# Patient Record
Sex: Female | Born: 1960 | Race: White | Hispanic: No | Marital: Married | Smoking: Never smoker
Health system: Southern US, Community
[De-identification: ages and names within clinical notes are randomized; demographics above are authoritative.]

## PROBLEM LIST (undated history)

## (undated) DIAGNOSIS — K589 Irritable bowel syndrome without diarrhea: Secondary | ICD-10-CM

## (undated) DIAGNOSIS — R0789 Other chest pain: Secondary | ICD-10-CM

## (undated) DIAGNOSIS — N3010 Interstitial cystitis (chronic) without hematuria: Secondary | ICD-10-CM

## (undated) DIAGNOSIS — D649 Anemia, unspecified: Secondary | ICD-10-CM

## (undated) DIAGNOSIS — K219 Gastro-esophageal reflux disease without esophagitis: Secondary | ICD-10-CM

## (undated) DIAGNOSIS — N3281 Overactive bladder: Secondary | ICD-10-CM

## (undated) DIAGNOSIS — W3400XA Accidental discharge from unspecified firearms or gun, initial encounter: Secondary | ICD-10-CM

## (undated) DIAGNOSIS — F502 Bulimia nervosa, unspecified: Secondary | ICD-10-CM

## (undated) DIAGNOSIS — E785 Hyperlipidemia, unspecified: Secondary | ICD-10-CM

## (undated) HISTORY — DX: Accidental discharge from unspecified firearms or gun, initial encounter: W34.00XA

## (undated) HISTORY — PX: OTHER SURGICAL HISTORY: SHX169

## (undated) HISTORY — DX: Gastro-esophageal reflux disease without esophagitis: K21.9

## (undated) HISTORY — DX: Bulimia nervosa: F50.2

## (undated) HISTORY — DX: Other chest pain: R07.89

## (undated) HISTORY — DX: Interstitial cystitis (chronic) without hematuria: N30.10

## (undated) HISTORY — PX: APPENDECTOMY: SHX54

## (undated) HISTORY — PX: BREAST ENHANCEMENT SURGERY: SHX7

## (undated) HISTORY — DX: Irritable bowel syndrome, unspecified: K58.9

## (undated) HISTORY — DX: Overactive bladder: N32.81

## (undated) HISTORY — PX: EXCISIONAL HEMORRHOIDECTOMY: SHX1541

## (undated) HISTORY — PX: ABDOMINAL EXPLORATION SURGERY: SHX538

## (undated) HISTORY — PX: CHOLECYSTECTOMY: SHX55

## (undated) HISTORY — DX: Hyperlipidemia, unspecified: E78.5

## (undated) HISTORY — DX: Bulimia nervosa, unspecified: F50.20

## (undated) HISTORY — DX: Anemia, unspecified: D64.9

---

## 1989-02-28 HISTORY — PX: AUGMENTATION MAMMAPLASTY: SUR837

## 2001-06-14 LAB — HM PAP SMEAR

## 2003-12-17 ENCOUNTER — Ambulatory Visit: Payer: Self-pay | Admitting: Internal Medicine

## 2004-08-05 ENCOUNTER — Ambulatory Visit: Payer: Self-pay

## 2004-09-24 ENCOUNTER — Other Ambulatory Visit: Payer: Self-pay

## 2004-09-27 ENCOUNTER — Ambulatory Visit: Payer: Self-pay | Admitting: Internal Medicine

## 2005-03-28 ENCOUNTER — Ambulatory Visit: Payer: Self-pay | Admitting: Internal Medicine

## 2006-04-24 ENCOUNTER — Ambulatory Visit: Payer: Self-pay | Admitting: Internal Medicine

## 2006-04-24 LAB — HM DEXA SCAN

## 2006-04-27 ENCOUNTER — Ambulatory Visit: Payer: Self-pay | Admitting: Internal Medicine

## 2006-08-18 ENCOUNTER — Ambulatory Visit: Payer: Self-pay | Admitting: Gastroenterology

## 2006-08-18 LAB — HM COLONOSCOPY

## 2007-06-13 ENCOUNTER — Ambulatory Visit: Payer: Self-pay | Admitting: Internal Medicine

## 2007-06-15 ENCOUNTER — Ambulatory Visit: Payer: Self-pay | Admitting: Internal Medicine

## 2007-06-20 ENCOUNTER — Ambulatory Visit: Payer: Self-pay | Admitting: Internal Medicine

## 2007-07-03 ENCOUNTER — Encounter: Admission: RE | Admit: 2007-07-03 | Discharge: 2007-07-03 | Payer: Self-pay | Admitting: Internal Medicine

## 2007-08-06 ENCOUNTER — Ambulatory Visit: Payer: Self-pay | Admitting: General Surgery

## 2008-02-08 ENCOUNTER — Emergency Department: Payer: Self-pay | Admitting: Emergency Medicine

## 2008-02-29 HISTORY — PX: BREAST BIOPSY: SHX20

## 2008-05-21 LAB — HM PAP SMEAR: HM Pap smear: NEGATIVE

## 2008-07-30 ENCOUNTER — Ambulatory Visit: Payer: Self-pay | Admitting: Urology

## 2008-12-15 ENCOUNTER — Ambulatory Visit: Payer: Self-pay | Admitting: Internal Medicine

## 2010-03-24 ENCOUNTER — Ambulatory Visit: Payer: Self-pay | Admitting: Internal Medicine

## 2010-05-15 ENCOUNTER — Inpatient Hospital Stay: Payer: Self-pay | Admitting: Internal Medicine

## 2011-06-15 ENCOUNTER — Ambulatory Visit: Payer: Self-pay | Admitting: Internal Medicine

## 2012-01-17 ENCOUNTER — Other Ambulatory Visit: Payer: Self-pay | Admitting: Internal Medicine

## 2012-01-17 ENCOUNTER — Telehealth: Payer: Self-pay | Admitting: Internal Medicine

## 2012-01-17 DIAGNOSIS — E7800 Pure hypercholesterolemia, unspecified: Secondary | ICD-10-CM

## 2012-01-17 NOTE — Telephone Encounter (Signed)
Pt spouse came in today and wanted to get rx for citalopram  competely out meds [facility] Pt also has been taking lipitor and has never had lab work for this. Do you want labs prior to her visit  If labs are wanted 01/25/12

## 2012-01-17 NOTE — Telephone Encounter (Signed)
i am ok with refilling her citalopram 10mg .  (will need to clarify how she takes the medicine).  i think she is on 1 1/2 tab q day but clarify with her and then ok to refill x 3.  Also regarding lab - she can come in for lipid profile and liver panel.  i will order - need to schedule appt.

## 2012-02-29 ENCOUNTER — Emergency Department: Payer: Self-pay | Admitting: Unknown Physician Specialty

## 2012-02-29 LAB — URINALYSIS, COMPLETE
Bilirubin,UR: NEGATIVE
Blood: NEGATIVE
Glucose,UR: NEGATIVE mg/dL (ref 0–75)
Ketone: NEGATIVE
Ph: 7 (ref 4.5–8.0)
Specific Gravity: 1.014 (ref 1.003–1.030)
Squamous Epithelial: 1

## 2012-02-29 LAB — CBC
HGB: 13.3 g/dL (ref 12.0–16.0)
MCH: 31 pg (ref 26.0–34.0)
MCHC: 34 g/dL (ref 32.0–36.0)
MCV: 91 fL (ref 80–100)
Platelet: 210 10*3/uL (ref 150–440)
RBC: 4.28 10*6/uL (ref 3.80–5.20)
RDW: 12.4 % (ref 11.5–14.5)
WBC: 7.2 10*3/uL (ref 3.6–11.0)

## 2012-02-29 LAB — COMPREHENSIVE METABOLIC PANEL
Bilirubin,Total: 0.2 mg/dL (ref 0.2–1.0)
Chloride: 105 mmol/L (ref 98–107)
Co2: 28 mmol/L (ref 21–32)
Creatinine: 1.01 mg/dL (ref 0.60–1.30)
EGFR (Non-African Amer.): >60
Glucose: 99 mg/dL (ref 65–99)
Osmolality: 281 (ref 275–301)
Potassium: 3.4 mmol/L — ABNORMAL LOW (ref 3.5–5.1)
SGOT(AST): 31 U/L (ref 15–37)
SGPT (ALT): 34 U/L (ref 12–78)

## 2012-02-29 LAB — LIPASE, BLOOD: Lipase: 146 U/L (ref 73–393)

## 2012-04-04 ENCOUNTER — Encounter: Payer: Self-pay | Admitting: Internal Medicine

## 2012-04-04 DIAGNOSIS — Z860100 Personal history of colon polyps, unspecified: Secondary | ICD-10-CM

## 2012-04-06 ENCOUNTER — Encounter: Payer: Self-pay | Admitting: Internal Medicine

## 2012-04-06 ENCOUNTER — Ambulatory Visit (INDEPENDENT_AMBULATORY_CARE_PROVIDER_SITE_OTHER): Payer: BC Managed Care – PPO | Admitting: Internal Medicine

## 2012-04-06 VITALS — BP 120/80 | HR 98 | Temp 98.2°F | Ht 64.5 in | Wt 132.8 lb

## 2012-04-06 DIAGNOSIS — F418 Other specified anxiety disorders: Secondary | ICD-10-CM

## 2012-04-06 DIAGNOSIS — E7800 Pure hypercholesterolemia, unspecified: Secondary | ICD-10-CM

## 2012-04-06 DIAGNOSIS — R5383 Other fatigue: Secondary | ICD-10-CM

## 2012-04-06 DIAGNOSIS — F411 Generalized anxiety disorder: Secondary | ICD-10-CM

## 2012-04-06 DIAGNOSIS — K5289 Other specified noninfective gastroenteritis and colitis: Secondary | ICD-10-CM

## 2012-04-06 DIAGNOSIS — K529 Noninfective gastroenteritis and colitis, unspecified: Secondary | ICD-10-CM

## 2012-04-06 DIAGNOSIS — R5381 Other malaise: Secondary | ICD-10-CM

## 2012-04-06 DIAGNOSIS — D649 Anemia, unspecified: Secondary | ICD-10-CM

## 2012-04-06 MED ORDER — PANTOPRAZOLE SODIUM 40 MG PO TBEC: 40.0000 mg | DELAYED_RELEASE_TABLET | Freq: Every day | ORAL | Status: DC

## 2012-04-06 MED ORDER — CITALOPRAM HYDROBROMIDE 10 MG PO TABS: ORAL_TABLET | ORAL | Status: DC

## 2012-04-08 ENCOUNTER — Encounter: Payer: Self-pay | Admitting: Internal Medicine

## 2012-04-08 DIAGNOSIS — E7800 Pure hypercholesterolemia, unspecified: Secondary | ICD-10-CM | POA: Insufficient documentation

## 2012-04-08 DIAGNOSIS — D649 Anemia, unspecified: Secondary | ICD-10-CM | POA: Insufficient documentation

## 2012-04-08 DIAGNOSIS — K529 Noninfective gastroenteritis and colitis, unspecified: Secondary | ICD-10-CM | POA: Insufficient documentation

## 2012-04-08 DIAGNOSIS — F418 Other specified anxiety disorders: Secondary | ICD-10-CM | POA: Insufficient documentation

## 2012-04-08 NOTE — Assessment & Plan Note (Signed)
Recheck cbc.  

## 2012-04-08 NOTE — Assessment & Plan Note (Signed)
Increased stress.  Tolerated citalopram.  Will restart citalopram.  Start citalopram 10mg  - 1 1/2 tablet q day.  Follow.  Get her back in soon to reassess.

## 2012-04-08 NOTE — Assessment & Plan Note (Signed)
Currently symptoms stable.  Has had some flares.  Continues to follow up with GI.

## 2012-04-08 NOTE — Assessment & Plan Note (Signed)
Low cholesterol diet and exercise.  On atorvastatin.  Check lipid panel and liver function.
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
## 2012-04-11 ENCOUNTER — Telehealth: Payer: Self-pay | Admitting: Internal Medicine

## 2012-04-11 NOTE — Telephone Encounter (Signed)
Pt would like to get an order sent to [facility]  On [address] in [place].  Pt is going to try to go Friday.

## 2012-04-11 NOTE — Telephone Encounter (Signed)
Pt notified.  [facility] order form at desk.

## 2012-04-13 ENCOUNTER — Other Ambulatory Visit: Payer: BC Managed Care – PPO

## 2012-04-14 ENCOUNTER — Other Ambulatory Visit: Payer: Self-pay

## 2012-04-18 ENCOUNTER — Telehealth: Payer: Self-pay | Admitting: *Deleted

## 2012-04-18 NOTE — Telephone Encounter (Signed)
Left message on patients vm regarding lab results

## 2012-04-19 ENCOUNTER — Other Ambulatory Visit: Payer: Self-pay | Admitting: General Practice

## 2012-04-19 ENCOUNTER — Telehealth: Payer: Self-pay | Admitting: Internal Medicine

## 2012-04-19 DIAGNOSIS — E039 Hypothyroidism, unspecified: Secondary | ICD-10-CM

## 2012-04-19 MED ORDER — ATORVASTATIN CALCIUM 20 MG PO TABS: 20.0000 mg | ORAL_TABLET | Freq: Every day | ORAL | Status: DC

## 2012-04-19 NOTE — Telephone Encounter (Signed)
Please refer to the lab sheet on your desk.  You have already notified pt of her labs.  The follow up lab is a non fasting lab.  I have placed order to have drawn here.  Please schedule her for a non fasting lab appt time in 4 weeks.  Also I sent in to her pharmacy ([facility]) a prescription for lipitor 20mg  q day.  Would like for her to start this.

## 2012-04-20 NOTE — Telephone Encounter (Signed)
Notified patient's husband that her labs will be non-fasting.

## 2012-04-20 NOTE — Telephone Encounter (Signed)
Should have been sent to you.  See attached

## 2012-04-20 NOTE — Telephone Encounter (Signed)
Marcelino Duster I am sending ing this note to you regarding the patient. Working at [employer]

## 2012-04-26 ENCOUNTER — Other Ambulatory Visit (INDEPENDENT_AMBULATORY_CARE_PROVIDER_SITE_OTHER): Payer: BC Managed Care – PPO

## 2012-04-26 DIAGNOSIS — E039 Hypothyroidism, unspecified: Secondary | ICD-10-CM

## 2012-04-26 DIAGNOSIS — R5381 Other malaise: Secondary | ICD-10-CM

## 2012-04-26 DIAGNOSIS — E7800 Pure hypercholesterolemia, unspecified: Secondary | ICD-10-CM

## 2012-04-26 DIAGNOSIS — R5383 Other fatigue: Secondary | ICD-10-CM

## 2012-04-27 LAB — COMPREHENSIVE METABOLIC PANEL
ALT: 22 U/L (ref 0–35)
AST: 29 U/L (ref 0–37)
Alkaline Phosphatase: 92 U/L (ref 39–117)
BUN: 14 mg/dL (ref 6–23)
Calcium: 9.3 mg/dL (ref 8.4–10.5)
Creatinine, Ser: 0.9 mg/dL (ref 0.4–1.2)
Total Bilirubin: 0.5 mg/dL (ref 0.3–1.2)

## 2012-04-27 LAB — CBC WITH DIFFERENTIAL/PLATELET
Basophils Absolute: 0.1 10*3/uL (ref 0.0–0.1)
Basophils Relative: 0.8 % (ref 0.0–3.0)
Eosinophils Absolute: 0 10*3/uL (ref 0.0–0.7)
HCT: 36.2 % (ref 36.0–46.0)
Lymphocytes Relative: 26.9 % (ref 12.0–46.0)
Lymphs Abs: 2 10*3/uL (ref 0.7–4.0)
Monocytes Absolute: 0.4 10*3/uL (ref 0.1–1.0)
Neutro Abs: 4.9 10*3/uL (ref 1.4–7.7)
Neutrophils Relative %: 66.5 % (ref 43.0–77.0)
Platelets: 211 10*3/uL (ref 150.0–400.0)
WBC: 7.3 10*3/uL (ref 4.5–10.5)

## 2012-04-27 LAB — LIPID PANEL
Cholesterol: 204 mg/dL — ABNORMAL HIGH (ref 0–200)
HDL: 28.4 mg/dL — ABNORMAL LOW (ref >39.00)
Total CHOL/HDL Ratio: 7
Triglycerides: 373 mg/dL — ABNORMAL HIGH (ref 0.0–149.0)

## 2012-04-27 LAB — HEPATIC FUNCTION PANEL
ALT: 22 U/L (ref 0–35)
AST: 29 U/L (ref 0–37)
Albumin: 4.1 g/dL (ref 3.5–5.2)

## 2012-04-27 LAB — LDL CHOLESTEROL, DIRECT: Direct LDL: 122.5 mg/dL

## 2012-04-28 ENCOUNTER — Telehealth: Payer: Self-pay | Admitting: Internal Medicine

## 2012-04-28 NOTE — Telephone Encounter (Signed)
This pt should have only had a liver panel checked.  She had called and picked up a lab order for [facility] for the other labs.  Please delete charges for the other labs other than the liver panel.  Thanks.

## 2012-05-01 ENCOUNTER — Telehealth: Payer: Self-pay | Admitting: *Deleted

## 2012-05-01 NOTE — Telephone Encounter (Signed)
Faxed over the form so the other labs can be credited back to the patient

## 2012-05-02 ENCOUNTER — Telehealth: Payer: Self-pay | Admitting: *Deleted

## 2012-05-02 NOTE — Telephone Encounter (Signed)
Letter sent to patient letting her know that her labs from [facility] came back ok.

## 2012-05-21 ENCOUNTER — Ambulatory Visit: Payer: Self-pay | Admitting: Gastroenterology

## 2012-05-21 ENCOUNTER — Encounter: Payer: Self-pay | Admitting: Internal Medicine

## 2012-05-21 LAB — HM COLONOSCOPY

## 2012-05-22 LAB — PATHOLOGY REPORT

## 2012-06-06 DIAGNOSIS — Z86010 Personal history of colonic polyps: Secondary | ICD-10-CM | POA: Insufficient documentation

## 2012-06-14 ENCOUNTER — Encounter: Payer: BC Managed Care – PPO | Admitting: Internal Medicine

## 2012-06-15 ENCOUNTER — Encounter: Payer: Self-pay | Admitting: Internal Medicine

## 2012-06-18 ENCOUNTER — Ambulatory Visit (INDEPENDENT_AMBULATORY_CARE_PROVIDER_SITE_OTHER): Payer: BC Managed Care – PPO | Admitting: Internal Medicine

## 2012-06-18 ENCOUNTER — Encounter: Payer: Self-pay | Admitting: Internal Medicine

## 2012-06-18 ENCOUNTER — Other Ambulatory Visit (HOSPITAL_COMMUNITY)
Admission: RE | Admit: 2012-06-18 | Discharge: 2012-06-18 | Disposition: A | Discharge status: Home / Self Care | Payer: BC Managed Care – PPO | Source: Ambulatory Visit | Attending: Internal Medicine | Admitting: Internal Medicine

## 2012-06-18 VITALS — BP 128/70 | HR 98 | Temp 98.1°F | Resp 18 | Wt 132.0 lb

## 2012-06-18 DIAGNOSIS — F418 Other specified anxiety disorders: Secondary | ICD-10-CM

## 2012-06-18 DIAGNOSIS — Z1239 Encounter for other screening for malignant neoplasm of breast: Secondary | ICD-10-CM

## 2012-06-18 DIAGNOSIS — Z1151 Encounter for screening for human papillomavirus (HPV): Secondary | ICD-10-CM | POA: Insufficient documentation

## 2012-06-18 DIAGNOSIS — Z860100 Personal history of colon polyps, unspecified: Secondary | ICD-10-CM

## 2012-06-18 DIAGNOSIS — F411 Generalized anxiety disorder: Secondary | ICD-10-CM

## 2012-06-18 DIAGNOSIS — E7800 Pure hypercholesterolemia, unspecified: Secondary | ICD-10-CM

## 2012-06-18 DIAGNOSIS — Z124 Encounter for screening for malignant neoplasm of cervix: Secondary | ICD-10-CM

## 2012-06-18 DIAGNOSIS — D649 Anemia, unspecified: Secondary | ICD-10-CM

## 2012-06-18 DIAGNOSIS — F502 Bulimia nervosa, unspecified: Secondary | ICD-10-CM

## 2012-06-18 DIAGNOSIS — Z01419 Encounter for gynecological examination (general) (routine) without abnormal findings: Secondary | ICD-10-CM | POA: Insufficient documentation

## 2012-06-18 DIAGNOSIS — K5289 Other specified noninfective gastroenteritis and colitis: Secondary | ICD-10-CM

## 2012-06-18 DIAGNOSIS — K529 Noninfective gastroenteritis and colitis, unspecified: Secondary | ICD-10-CM

## 2012-06-18 DIAGNOSIS — K209 Esophagitis, unspecified without bleeding: Secondary | ICD-10-CM

## 2012-06-20 ENCOUNTER — Encounter: Payer: Self-pay | Admitting: Internal Medicine

## 2012-06-25 ENCOUNTER — Encounter: Payer: Self-pay | Admitting: Emergency Medicine

## 2012-06-25 ENCOUNTER — Encounter: Payer: Self-pay | Admitting: Internal Medicine

## 2012-06-25 DIAGNOSIS — F502 Bulimia nervosa: Secondary | ICD-10-CM | POA: Insufficient documentation

## 2012-06-25 NOTE — Assessment & Plan Note (Signed)
Just had colonoscopy as outlined.  Continues to follow up with Dr Oh.   
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
## 2012-06-25 NOTE — Assessment & Plan Note (Signed)
Increased stress.  On citalopram 10mg  - 1 1/2 tablet q day.  Doing better.  Follow.

## 2012-06-25 NOTE — Assessment & Plan Note (Signed)
Low cholesterol diet and exercise.  Did not tolerate the statin.  Off now.  Follow lipid panel and liver function.   

## 2012-06-25 NOTE — Assessment & Plan Note (Signed)
Just had colonoscopy and EGD with results as outlined.  Follow cbc.    

## 2012-06-25 NOTE — Assessment & Plan Note (Signed)
Currently doing well.  Follow.  

## 2012-06-25 NOTE — Assessment & Plan Note (Signed)
Doing better.  Follow.   

## 2012-07-20 ENCOUNTER — Ambulatory Visit: Payer: Self-pay | Admitting: Internal Medicine

## 2012-07-21 ENCOUNTER — Encounter: Payer: Self-pay | Admitting: Internal Medicine

## 2012-07-27 ENCOUNTER — Other Ambulatory Visit: Payer: Self-pay | Admitting: *Deleted

## 2012-07-27 MED ORDER — PANTOPRAZOLE SODIUM 40 MG PO TBEC: 40.0000 mg | DELAYED_RELEASE_TABLET | Freq: Every day | ORAL | Status: DC

## 2012-07-27 MED ORDER — CITALOPRAM HYDROBROMIDE 10 MG PO TABS: ORAL_TABLET | ORAL | Status: DC

## 2012-09-12 ENCOUNTER — Encounter: Payer: Self-pay | Admitting: Internal Medicine

## 2012-09-24 ENCOUNTER — Telehealth: Payer: Self-pay | Admitting: *Deleted

## 2012-09-24 NOTE — Telephone Encounter (Signed)
Called [phone] for prior authorization on the Pantoprazole SOD Dr 20 mg,  Received fax placed in Dr. Lorin Picket folder

## 2012-10-01 NOTE — Telephone Encounter (Signed)
Faxed over [place] request form to [phone]

## 2012-10-02 NOTE — Telephone Encounter (Signed)
Received a fax from [facility], Pantoprazole sodium tablet Dr has been APPROVED   07.14.2014-08.04.2015

## 2012-11-26 ENCOUNTER — Telehealth: Payer: Self-pay | Admitting: Internal Medicine

## 2012-11-26 NOTE — Telephone Encounter (Signed)
Please advise (Copy was placed at your station this morning also)

## 2012-11-26 NOTE — Telephone Encounter (Signed)
°  See below my chart message  Appointment Request From: Linda Perez      With Provider: Charm Barges, MD [-Primary Care Physician-]      Preferred Date Range: From 11/27/2012 To 11/30/2012      Preferred Times: Wednesday Afternoon, Thursday Afternoon, Friday Afternoon      Reason for visit: Annual Physical      Comments:   Dr. Lorin Picket,   I am having some discomfort in my chest along with some jaw pain on my right side and sometimes mild pain in my left arm. The discomfort comes and goes but I've had it for the past couple of weeks. Could you get me an immediate appointment as late in the day as possible with a cardiologists? I don't want to put this off any longer. My number is [phone].      Thank you,   Linda Perez

## 2012-11-26 NOTE — Telephone Encounter (Signed)
LVM for patient on home and cell phone for her to call us in reference to apt scheduled with [facility] 11/28/12 @ 3:15.

## 2012-11-26 NOTE — Telephone Encounter (Signed)
Thanks, let me know if you do not hear back from her.  Thanks.

## 2012-12-11 ENCOUNTER — Encounter: Payer: Self-pay | Admitting: Internal Medicine

## 2012-12-14 ENCOUNTER — Other Ambulatory Visit: Payer: BC Managed Care – PPO

## 2012-12-20 ENCOUNTER — Ambulatory Visit: Payer: BC Managed Care – PPO | Admitting: Internal Medicine

## 2013-01-03 ENCOUNTER — Other Ambulatory Visit: Payer: Self-pay

## 2013-01-08 ENCOUNTER — Other Ambulatory Visit (INDEPENDENT_AMBULATORY_CARE_PROVIDER_SITE_OTHER): Payer: BC Managed Care – PPO

## 2013-01-08 ENCOUNTER — Encounter: Payer: Self-pay | Admitting: Internal Medicine

## 2013-01-08 DIAGNOSIS — E7800 Pure hypercholesterolemia, unspecified: Secondary | ICD-10-CM

## 2013-01-08 LAB — LIPID PANEL
Cholesterol: 208 mg/dL — ABNORMAL HIGH (ref 0–200)
Total CHOL/HDL Ratio: 6

## 2013-01-09 ENCOUNTER — Encounter: Payer: Self-pay | Admitting: Internal Medicine

## 2013-01-10 ENCOUNTER — Encounter: Payer: Self-pay | Admitting: Internal Medicine

## 2013-01-10 NOTE — Telephone Encounter (Signed)
Mailed unread message to pt  

## 2013-01-17 ENCOUNTER — Ambulatory Visit (INDEPENDENT_AMBULATORY_CARE_PROVIDER_SITE_OTHER): Payer: BC Managed Care – PPO | Admitting: Internal Medicine

## 2013-01-17 ENCOUNTER — Encounter: Payer: Self-pay | Admitting: Internal Medicine

## 2013-01-17 VITALS — BP 120/80 | HR 93 | Temp 98.0°F | Ht 64.5 in | Wt 130.5 lb

## 2013-01-17 DIAGNOSIS — F411 Generalized anxiety disorder: Secondary | ICD-10-CM

## 2013-01-17 DIAGNOSIS — E7800 Pure hypercholesterolemia, unspecified: Secondary | ICD-10-CM

## 2013-01-17 DIAGNOSIS — F502 Bulimia nervosa: Secondary | ICD-10-CM

## 2013-01-17 DIAGNOSIS — F418 Other specified anxiety disorders: Secondary | ICD-10-CM

## 2013-01-17 DIAGNOSIS — Z86010 Personal history of colonic polyps: Secondary | ICD-10-CM

## 2013-01-17 DIAGNOSIS — D649 Anemia, unspecified: Secondary | ICD-10-CM

## 2013-01-17 NOTE — Progress Notes (Signed)
Pre-visit discussion using our clinic review tool. No additional management support is needed unless otherwise documented below in the visit note.  

## 2013-01-20 ENCOUNTER — Encounter: Payer: Self-pay | Admitting: Internal Medicine

## 2013-01-20 ENCOUNTER — Telehealth: Payer: Self-pay | Admitting: Internal Medicine

## 2013-01-20 NOTE — Assessment & Plan Note (Signed)
Just had colonoscopy as outlined.  Continues to follow up with Dr Oh.   

## 2013-01-20 NOTE — Telephone Encounter (Signed)
Needs a physical schedule in 4/15 - after 06/18/13.  Please schedule and notify pt of appt date and time.  Thanks.
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
## 2013-01-20 NOTE — Assessment & Plan Note (Signed)
Low cholesterol diet and exercise.  Did not tolerate the statin.  Off now.  Follow lipid panel and liver function.   

## 2013-01-20 NOTE — Assessment & Plan Note (Signed)
Increased stress.  On citalopram 10mg - 1 1/2 tablet q day.  Doing better.  Follow.  Will continue.   

## 2013-01-20 NOTE — Assessment & Plan Note (Signed)
Just had colonoscopy and EGD with results as outlined.  Follow cbc.    

## 2013-01-20 NOTE — Assessment & Plan Note (Signed)
Doing better.  Follow.   

## 2013-01-20 NOTE — Assessment & Plan Note (Signed)
Currently doing well.  Follow.  

## 2013-01-21 ENCOUNTER — Encounter: Payer: Self-pay | Admitting: Internal Medicine

## 2013-01-21 NOTE — Telephone Encounter (Signed)
Left message for pt to call office

## 2013-01-22 NOTE — Telephone Encounter (Signed)
Made appointment for 4/27 @ 3:30  Send pt my chart message

## 2013-01-22 NOTE — Telephone Encounter (Signed)
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 I sent her a mychart asking her to call the office to schedule her physical. Or you can send her an appointment through mychart.

## 2013-02-05 NOTE — Telephone Encounter (Signed)
Appt has been scheduled already for 06/24/13

## 2013-04-16 ENCOUNTER — Ambulatory Visit: Payer: Self-pay | Admitting: Physician Assistant

## 2013-06-24 ENCOUNTER — Encounter: Payer: BC Managed Care – PPO | Admitting: Internal Medicine

## 2013-07-16 ENCOUNTER — Telehealth: Payer: Self-pay | Admitting: Internal Medicine

## 2013-07-16 ENCOUNTER — Encounter: Payer: Self-pay | Admitting: *Deleted

## 2013-07-16 DIAGNOSIS — Z1239 Encounter for other screening for malignant neoplasm of breast: Secondary | ICD-10-CM

## 2013-07-16 NOTE — Telephone Encounter (Signed)
The Tetanus - she had in 2010.  If needs hepatitis A&B - this is a series of shots (over 6 months).  I would recommend her go to the travel clinic in [place] and let them know exactly where she is going and then can give her the shots needed.

## 2013-07-16 NOTE — Telephone Encounter (Signed)
See below my chart message  Is it ok to schedule immunizations  Appointment Request From: Linda Perez      With Provider: Alisa Graff, MD [-Primary Care Physician-]      Preferred Date Range: From 08/19/2013 To 08/30/2013      Preferred Times: Monday Morning, Tuesday Morning, Wednesday Morning, Thursday Morning, Friday Morning      Reason for visit: Office Visit      Comments:   Dr. Nicki Reaper,   I am in need of a mammogram and I also need to check on my immunizations. I am traveling to [place] in July and I will need Hepatitis A &B shots and I may be due for a Tetanus as well. Please let me know how soon I can come in for these. I can be there by 4:15 most any afternoon.      Thank you,      Linda Perez

## 2013-07-16 NOTE — Telephone Encounter (Signed)
Response sent to patient via mychart.

## 2013-07-18 NOTE — Telephone Encounter (Signed)
Unread mychart message mailed to patient 

## 2013-07-19 ENCOUNTER — Encounter: Payer: Self-pay | Admitting: Internal Medicine

## 2013-07-23 ENCOUNTER — Encounter: Payer: Self-pay | Admitting: Internal Medicine

## 2013-08-08 ENCOUNTER — Other Ambulatory Visit: Payer: Self-pay | Admitting: Internal Medicine

## 2013-08-20 ENCOUNTER — Encounter: Payer: Self-pay | Admitting: Internal Medicine

## 2013-08-26 ENCOUNTER — Telehealth: Payer: Self-pay | Admitting: *Deleted

## 2013-08-26 NOTE — Telephone Encounter (Signed)
Pt states that [facility] in [place] has contacted [place] a few times for the status of her Typhoid oral vaccine Rx. Please advise on status of form. Pt states that she needs it by tomorrow 08/27/13. Please advise when ready

## 2013-08-26 NOTE — Telephone Encounter (Signed)
Please call pt (since stating needs tomorrow) and find out if she went to the [facility] - as instructed 07/16/13.  If so, did they recommend the typhoid oral vaccine.  This is not something that I routinely prescribe.  Need more information.  Have never prescribed this previously.

## 2013-08-26 NOTE — Telephone Encounter (Signed)
Pt states that she did not go to the [facility] clinic as recommended. The Typhoid vaccine was recommended in the travel brochure. Her husband's pcp approved his Rx & her friends pcp has approved her Rx. She has already received the Hep A & B vaccines.

## 2013-08-27 NOTE — Telephone Encounter (Signed)
Noted  

## 2013-08-27 NOTE — Telephone Encounter (Signed)
Spoke to Infectious Disease.  Please notify pt that vaccine is a live virus.  Make sure medications have not changed.  Make sure that she is not on any medication that could suppress her immune system.

## 2013-08-27 NOTE — Telephone Encounter (Signed)
Pt notified & stated that her medication list is the same. I called out each medication on her list & pt verified that they were all correct with not additional medications. Rx for Typhoid was phoned in to [facility] in [place] & given verbally. Pt notified

## 2013-09-04 ENCOUNTER — Other Ambulatory Visit: Payer: Self-pay | Admitting: Internal Medicine

## 2013-09-04 NOTE — Telephone Encounter (Signed)
Noted.  Please hold and confirm pt calls in.  Thanks.

## 2013-09-04 NOTE — Telephone Encounter (Signed)
Spoke with pt, she is going to call back on 7.9.15 when she has pill bottle in her hand.  She is unsure what medication she is on.

## 2013-09-04 NOTE — Telephone Encounter (Signed)
It is listed on pts medication list that she is taking protonix and prilosec.  Need to confirm she is only taking protonix and not prilosec.  There can be an interaction between prilosec and citalopram.  Need to clarify this before rx sent in.  Thanks.

## 2013-09-04 NOTE — Telephone Encounter (Signed)
Last refill 4.13.15, last OV 11.20.14, future OV 8.4.15.  Please advise refill

## 2013-09-05 NOTE — Telephone Encounter (Signed)
See note sent to [facility] regarding refill.  Pt was to call back and let us know if on protonix or prilosec.  There can be an interaction between prilosec and citalopram.  Need to clarify before refilling.

## 2013-09-05 NOTE — Telephone Encounter (Signed)
Please advise ok to fill 

## 2013-09-06 MED ORDER — CITALOPRAM HYDROBROMIDE 10 MG PO TABS: 10.0000 mg | ORAL_TABLET | Freq: Every day | ORAL | Status: DC

## 2013-09-06 NOTE — Telephone Encounter (Signed)
Patient confirmed she is only taking Protonix  Appointment time and location given to patient and will note on my chart for reminder as patient requested.

## 2013-09-06 NOTE — Telephone Encounter (Signed)
Ok to fill 

## 2013-09-06 NOTE — Telephone Encounter (Signed)
See previous message regarding citalopram.  I am ok refilling, but just need to clarify she is not taking prilosec and is only on protonix.  Also please let her know that Infectious disease MD (Dr Ola Spurr) can see her 09/16/13 (9:15) to discuss her upcoming trip.  I had discussed this with her.  Thanks.

## 2013-09-06 NOTE — Telephone Encounter (Signed)
Refilled citalopram #30 with 3 refills.

## 2013-09-10 NOTE — Telephone Encounter (Signed)
Pt is on Protonix (Prilosec was removed from list on 09/06/13)

## 2013-09-12 ENCOUNTER — Telehealth: Payer: Self-pay

## 2013-09-12 NOTE — Telephone Encounter (Signed)
[facility] called and stated the patient has an appt with Dr.Fitzgerald regarding a typhoid vaccination on July 20th at 9:15am.  All demo and insurance information was relayed to their office.

## 2013-10-01 ENCOUNTER — Encounter: Payer: Self-pay | Admitting: Internal Medicine

## 2013-10-01 ENCOUNTER — Ambulatory Visit (INDEPENDENT_AMBULATORY_CARE_PROVIDER_SITE_OTHER): Payer: BC Managed Care – PPO | Admitting: Internal Medicine

## 2013-10-01 VITALS — BP 90/60 | HR 102 | Temp 98.1°F | Ht 64.0 in | Wt 123.5 lb

## 2013-10-01 DIAGNOSIS — D649 Anemia, unspecified: Secondary | ICD-10-CM

## 2013-10-01 DIAGNOSIS — F502 Bulimia nervosa: Secondary | ICD-10-CM

## 2013-10-01 DIAGNOSIS — F418 Other specified anxiety disorders: Secondary | ICD-10-CM

## 2013-10-01 DIAGNOSIS — Z1239 Encounter for other screening for malignant neoplasm of breast: Secondary | ICD-10-CM

## 2013-10-01 DIAGNOSIS — F411 Generalized anxiety disorder: Secondary | ICD-10-CM

## 2013-10-01 DIAGNOSIS — Z86010 Personal history of colonic polyps: Secondary | ICD-10-CM

## 2013-10-01 DIAGNOSIS — Z0000 Encounter for general adult medical examination without abnormal findings: Secondary | ICD-10-CM

## 2013-10-01 DIAGNOSIS — K209 Esophagitis, unspecified without bleeding: Secondary | ICD-10-CM

## 2013-10-01 DIAGNOSIS — E7800 Pure hypercholesterolemia, unspecified: Secondary | ICD-10-CM

## 2013-10-01 MED ORDER — HYOSCYAMINE SULFATE 0.125 MG SL SUBL: 0.1250 mg | SUBLINGUAL_TABLET | Freq: Two times a day (BID) | SUBLINGUAL | Status: DC | PRN

## 2013-10-01 NOTE — Progress Notes (Signed)
Pre visit review using our clinic review tool, if applicable. No additional management support is needed unless otherwise documented below in the visit note. 

## 2013-10-06 ENCOUNTER — Encounter: Payer: Self-pay | Admitting: Internal Medicine

## 2013-10-06 NOTE — Assessment & Plan Note (Signed)
Doing better.  Follow.   

## 2013-10-06 NOTE — Assessment & Plan Note (Signed)
Increased stress.  On citalopram 10mg  - 1 1/2 tablet q day.  Doing better.  Follow.  Will continue.

## 2013-10-06 NOTE — Assessment & Plan Note (Signed)
Low cholesterol diet and exercise.  Did not tolerate the statin.  Off now.  Follow lipid panel and liver function.

## 2013-10-06 NOTE — Assessment & Plan Note (Signed)
Just had colonoscopy and EGD with results as outlined.  Follow cbc.

## 2013-10-06 NOTE — Assessment & Plan Note (Signed)
Currently doing well.  Follow.  

## 2013-10-06 NOTE — Assessment & Plan Note (Signed)
Just had colonoscopy as outlined.  Continues to follow up with Dr Candace Cruise.
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
## 2013-10-08 ENCOUNTER — Other Ambulatory Visit (INDEPENDENT_AMBULATORY_CARE_PROVIDER_SITE_OTHER): Payer: BC Managed Care – PPO

## 2013-10-08 DIAGNOSIS — E7800 Pure hypercholesterolemia, unspecified: Secondary | ICD-10-CM

## 2013-10-08 DIAGNOSIS — D649 Anemia, unspecified: Secondary | ICD-10-CM

## 2013-10-08 DIAGNOSIS — F418 Other specified anxiety disorders: Secondary | ICD-10-CM

## 2013-10-08 DIAGNOSIS — F411 Generalized anxiety disorder: Secondary | ICD-10-CM

## 2013-10-08 DIAGNOSIS — F502 Bulimia nervosa: Secondary | ICD-10-CM

## 2013-10-08 LAB — CBC WITH DIFFERENTIAL/PLATELET
BASOS ABS: 0 10*3/uL (ref 0.0–0.1)
Basophils Relative: 0.2 % (ref 0.0–3.0)
Eosinophils Absolute: 0 10*3/uL (ref 0.0–0.7)
Eosinophils Relative: 0.3 % (ref 0.0–5.0)
HCT: 36.7 % (ref 36.0–46.0)
HEMOGLOBIN: 12.6 g/dL (ref 12.0–15.0)
LYMPHS ABS: 1.7 10*3/uL (ref 0.7–4.0)
LYMPHS PCT: 31.2 % (ref 12.0–46.0)
MCHC: 34.3 g/dL (ref 30.0–36.0)
MCV: 90.2 fl (ref 78.0–100.0)
Monocytes Absolute: 0.3 10*3/uL (ref 0.1–1.0)
Monocytes Relative: 6 % (ref 3.0–12.0)
NEUTROS ABS: 3.4 10*3/uL (ref 1.4–7.7)
Neutrophils Relative %: 62.3 % (ref 43.0–77.0)
Platelets: 198 10*3/uL (ref 150.0–400.0)
RBC: 4.07 Mil/uL (ref 3.87–5.11)
RDW: 12.6 % (ref 11.5–15.5)
WBC: 5.5 10*3/uL (ref 4.0–10.5)

## 2013-10-08 LAB — LDL CHOLESTEROL, DIRECT: Direct LDL: 76.4 mg/dL

## 2013-10-08 LAB — BASIC METABOLIC PANEL
BUN: 10 mg/dL (ref 6–23)
CALCIUM: 9.3 mg/dL (ref 8.4–10.5)
CO2: 27 meq/L (ref 19–32)
CREATININE: 1.1 mg/dL (ref 0.4–1.2)
Chloride: 103 mEq/L (ref 96–112)
GFR: 55.37 mL/min — ABNORMAL LOW (ref >60.00)
GLUCOSE: 82 mg/dL (ref 70–99)
Potassium: 3.9 mEq/L (ref 3.5–5.1)
Sodium: 140 mEq/L (ref 135–145)

## 2013-10-08 LAB — HEPATIC FUNCTION PANEL
ALT: 16 U/L (ref 0–35)
AST: 24 U/L (ref 0–37)
Albumin: 3.8 g/dL (ref 3.5–5.2)
Alkaline Phosphatase: 83 U/L (ref 39–117)
BILIRUBIN TOTAL: 0.6 mg/dL (ref 0.2–1.2)
Bilirubin, Direct: 0.1 mg/dL (ref 0.0–0.3)
Total Protein: 6.9 g/dL (ref 6.0–8.3)

## 2013-10-08 LAB — LIPID PANEL
Cholesterol: 208 mg/dL — ABNORMAL HIGH (ref 0–200)
HDL: 25.7 mg/dL — ABNORMAL LOW (ref >39.00)
NONHDL: 182.3
Total CHOL/HDL Ratio: 8
VLDL: 123.6 mg/dL — ABNORMAL HIGH (ref 0.0–40.0)

## 2013-10-08 LAB — TSH: TSH: 1.96 u[IU]/mL (ref 0.35–4.50)

## 2013-10-10 ENCOUNTER — Other Ambulatory Visit: Payer: Self-pay | Admitting: Internal Medicine

## 2013-10-10 ENCOUNTER — Encounter: Payer: Self-pay | Admitting: *Deleted

## 2013-10-10 ENCOUNTER — Encounter: Payer: Self-pay | Admitting: Internal Medicine

## 2013-10-10 DIAGNOSIS — F502 Bulimia nervosa: Secondary | ICD-10-CM

## 2013-10-10 NOTE — Progress Notes (Signed)
Order placed for f/u met b.  

## 2013-10-12 MED ORDER — OMEGA-3-ACID ETHYL ESTERS 1 G PO CAPS: ORAL_CAPSULE | ORAL | Status: DC

## 2013-10-12 NOTE — Telephone Encounter (Signed)
rx sent in for lovaza #120 with 3 refills.

## 2013-10-15 NOTE — Telephone Encounter (Signed)
Unread mychart message mailed to patient 

## 2013-10-18 ENCOUNTER — Other Ambulatory Visit: Payer: Self-pay | Admitting: *Deleted

## 2013-10-18 MED ORDER — OMEGA-3-ACID ETHYL ESTERS 1 G PO CAPS: ORAL_CAPSULE | ORAL | Status: DC

## 2013-10-18 NOTE — Telephone Encounter (Signed)
Pt states pharmacy does not have Lovaza Rx sent in earlier this morning. Rx sent to pharmacy by escript

## 2013-10-22 ENCOUNTER — Ambulatory Visit: Payer: Self-pay | Admitting: Internal Medicine

## 2013-10-24 ENCOUNTER — Other Ambulatory Visit (INDEPENDENT_AMBULATORY_CARE_PROVIDER_SITE_OTHER): Payer: BC Managed Care – PPO

## 2013-10-24 ENCOUNTER — Other Ambulatory Visit: Payer: BC Managed Care – PPO

## 2013-10-24 DIAGNOSIS — F502 Bulimia nervosa: Secondary | ICD-10-CM

## 2013-10-25 ENCOUNTER — Encounter: Payer: Self-pay | Admitting: Internal Medicine

## 2013-10-25 LAB — BASIC METABOLIC PANEL
BUN: 14 mg/dL (ref 6–23)
CALCIUM: 9.5 mg/dL (ref 8.4–10.5)
CO2: 29 mEq/L (ref 19–32)
Chloride: 103 mEq/L (ref 96–112)
Creatinine, Ser: 1 mg/dL (ref 0.4–1.2)
GFR: 61.86 mL/min (ref >60.00)
GLUCOSE: 87 mg/dL (ref 70–99)
Potassium: 4 mEq/L (ref 3.5–5.1)
Sodium: 140 mEq/L (ref 135–145)

## 2013-10-26 ENCOUNTER — Telehealth: Payer: Self-pay | Admitting: Internal Medicine

## 2013-10-26 DIAGNOSIS — E7800 Pure hypercholesterolemia, unspecified: Secondary | ICD-10-CM

## 2013-10-26 NOTE — Telephone Encounter (Signed)
Pt needs a fasting lab in 4 months.  Please schedule and contact her with a lab appt date and time.  Thanks.

## 2013-10-28 NOTE — Telephone Encounter (Signed)
Lab appt on: 03/03/14 (Mon) @ 8:45 AM (letter mailed along with unread mychart message)

## 2013-10-28 NOTE — Telephone Encounter (Signed)
Unread mychart message mailed  

## 2014-01-21 ENCOUNTER — Other Ambulatory Visit: Payer: Self-pay | Admitting: Internal Medicine

## 2014-02-19 ENCOUNTER — Encounter: Payer: Self-pay | Admitting: Internal Medicine

## 2014-02-27 ENCOUNTER — Other Ambulatory Visit (INDEPENDENT_AMBULATORY_CARE_PROVIDER_SITE_OTHER): Payer: BC Managed Care – PPO

## 2014-02-27 DIAGNOSIS — E7800 Pure hypercholesterolemia, unspecified: Secondary | ICD-10-CM

## 2014-02-27 LAB — COMPREHENSIVE METABOLIC PANEL
ALBUMIN: 4.1 g/dL (ref 3.5–5.2)
ALT: 19 U/L (ref 0–35)
AST: 24 U/L (ref 0–37)
Alkaline Phosphatase: 92 U/L (ref 39–117)
BUN: 10 mg/dL (ref 6–23)
CALCIUM: 9.1 mg/dL (ref 8.4–10.5)
CHLORIDE: 104 meq/L (ref 96–112)
CO2: 31 mEq/L (ref 19–32)
Creatinine, Ser: 1 mg/dL (ref 0.4–1.2)
GFR: 63.25 mL/min (ref >60.00)
GLUCOSE: 97 mg/dL (ref 70–99)
POTASSIUM: 3.9 meq/L (ref 3.5–5.1)
Sodium: 139 mEq/L (ref 135–145)
Total Bilirubin: 0.5 mg/dL (ref 0.2–1.2)
Total Protein: 7.2 g/dL (ref 6.0–8.3)

## 2014-02-27 LAB — LIPID PANEL
CHOLESTEROL: 266 mg/dL — AB (ref 0–200)
HDL: 29.9 mg/dL — ABNORMAL LOW (ref >39.00)
NonHDL: 236.1
Total CHOL/HDL Ratio: 9
Triglycerides: 598 mg/dL — ABNORMAL HIGH (ref 0.0–149.0)
VLDL: 119.6 mg/dL — ABNORMAL HIGH (ref 0.0–40.0)

## 2014-02-27 LAB — LDL CHOLESTEROL, DIRECT: Direct LDL: 103.7 mg/dL

## 2014-02-28 ENCOUNTER — Encounter: Payer: Self-pay | Admitting: Internal Medicine

## 2014-03-03 ENCOUNTER — Other Ambulatory Visit: Payer: BC Managed Care – PPO

## 2014-03-04 NOTE — Telephone Encounter (Signed)
Unread mychart message mailed to patient 

## 2014-04-03 ENCOUNTER — Ambulatory Visit: Payer: BC Managed Care – PPO | Admitting: Internal Medicine

## 2014-04-04 ENCOUNTER — Other Ambulatory Visit: Payer: Self-pay | Admitting: Internal Medicine

## 2014-04-07 ENCOUNTER — Telehealth: Payer: Self-pay | Admitting: Internal Medicine

## 2014-04-07 NOTE — Telephone Encounter (Signed)
Left message to call office to schedule physical appt 2/8.msn

## 2014-04-16 ENCOUNTER — Encounter: Payer: Self-pay | Admitting: Internal Medicine

## 2014-06-27 ENCOUNTER — Encounter: Payer: Self-pay | Admitting: Internal Medicine

## 2014-06-27 ENCOUNTER — Ambulatory Visit (INDEPENDENT_AMBULATORY_CARE_PROVIDER_SITE_OTHER): Payer: BC Managed Care – PPO | Admitting: Internal Medicine

## 2014-06-27 VITALS — BP 120/77 | HR 86 | Temp 98.0°F | Ht 64.0 in | Wt 121.2 lb

## 2014-06-27 DIAGNOSIS — F418 Other specified anxiety disorders: Secondary | ICD-10-CM

## 2014-06-27 DIAGNOSIS — Z0000 Encounter for general adult medical examination without abnormal findings: Secondary | ICD-10-CM

## 2014-06-27 DIAGNOSIS — Z860100 Personal history of colon polyps, unspecified: Secondary | ICD-10-CM

## 2014-06-27 DIAGNOSIS — D649 Anemia, unspecified: Secondary | ICD-10-CM | POA: Diagnosis not present

## 2014-06-27 DIAGNOSIS — K209 Esophagitis, unspecified without bleeding: Secondary | ICD-10-CM

## 2014-06-27 DIAGNOSIS — E7800 Pure hypercholesterolemia, unspecified: Secondary | ICD-10-CM

## 2014-06-27 DIAGNOSIS — F502 Bulimia nervosa, unspecified: Secondary | ICD-10-CM

## 2014-06-27 DIAGNOSIS — R634 Abnormal weight loss: Secondary | ICD-10-CM
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
## 2014-06-27 NOTE — Progress Notes (Signed)
Pre visit review using our clinic review tool, if applicable. No additional management support is needed unless otherwise documented below in the visit note. 

## 2014-06-29 ENCOUNTER — Telehealth: Payer: Self-pay | Admitting: Internal Medicine

## 2014-06-29 ENCOUNTER — Encounter: Payer: Self-pay | Admitting: Internal Medicine

## 2014-06-29 DIAGNOSIS — Z0000 Encounter for general adult medical examination without abnormal findings: Secondary | ICD-10-CM | POA: Insufficient documentation

## 2014-06-29 DIAGNOSIS — R634 Abnormal weight loss: Secondary | ICD-10-CM | POA: Insufficient documentation

## 2014-06-29 NOTE — Assessment & Plan Note (Signed)
On citalopram.  Continue.  Follow.

## 2014-06-29 NOTE — Assessment & Plan Note (Signed)
Has had colonoscopy and EGD.  Recheck cbc.  Currently appears to be doing well.

## 2014-06-29 NOTE — Telephone Encounter (Signed)
Pt needs physical after 10/02/14.  Also needs to be scheduled for fasting labs around 9:00 on 07/04/14.  Thanks

## 2014-06-29 NOTE — Assessment & Plan Note (Signed)
Discussed with her today.  Continue celexa.  Follow.

## 2014-06-29 NOTE — Assessment & Plan Note (Signed)
Physical 10/01/13.  PAP 06/19/13 - negative with negative HPV.  Mammogram 10/22/13 - Birads I.  Colonoscopy 04/2012.

## 2014-06-29 NOTE — Assessment & Plan Note (Signed)
Previous EGD revealed esophagitis and hiatal hernia.  Currently doing well.  On protonix.

## 2014-06-29 NOTE — Assessment & Plan Note (Signed)
Discussed with her today.  Discussed diet and triglycerides.  Follow closely.

## 2014-06-29 NOTE — Assessment & Plan Note (Signed)
Off lipitor.  Follow lipid panel.  Discussed diet and exercise.

## 2014-06-29 NOTE — Assessment & Plan Note (Signed)
Colonoscopy 05/21/12 - sigmoid colon polyp (tubular adenoma).  Follow.  Bowels stable.

## 2014-06-30 ENCOUNTER — Ambulatory Visit: Payer: BC Managed Care – PPO | Admitting: Internal Medicine

## 2014-07-04 ENCOUNTER — Other Ambulatory Visit (INDEPENDENT_AMBULATORY_CARE_PROVIDER_SITE_OTHER): Payer: BC Managed Care – PPO

## 2014-07-04 DIAGNOSIS — E7800 Pure hypercholesterolemia, unspecified: Secondary | ICD-10-CM

## 2014-07-04 DIAGNOSIS — R634 Abnormal weight loss: Secondary | ICD-10-CM

## 2014-07-04 LAB — BASIC METABOLIC PANEL
BUN: 12 mg/dL (ref 6–23)
CHLORIDE: 102 meq/L (ref 96–112)
CO2: 30 mEq/L (ref 19–32)
Calcium: 9.7 mg/dL (ref 8.4–10.5)
Creatinine, Ser: 1.1 mg/dL (ref 0.40–1.20)
GFR: 54.64 mL/min — ABNORMAL LOW (ref >60.00)
Glucose, Bld: 85 mg/dL (ref 70–99)
Potassium: 4 mEq/L (ref 3.5–5.1)
Sodium: 140 mEq/L (ref 135–145)

## 2014-07-04 LAB — LDL CHOLESTEROL, DIRECT: Direct LDL: 113 mg/dL

## 2014-07-04 LAB — CBC WITH DIFFERENTIAL/PLATELET
Basophils Absolute: 0 10*3/uL (ref 0.0–0.1)
Basophils Relative: 0.1 % (ref 0.0–3.0)
Eosinophils Absolute: 0 10*3/uL (ref 0.0–0.7)
Eosinophils Relative: 0.1 % (ref 0.0–5.0)
HCT: 38.7 % (ref 36.0–46.0)
Hemoglobin: 13.5 g/dL (ref 12.0–15.0)
LYMPHS ABS: 2.2 10*3/uL (ref 0.7–4.0)
Lymphocytes Relative: 30.9 % (ref 12.0–46.0)
MCHC: 34.8 g/dL (ref 30.0–36.0)
MCV: 89.2 fl (ref 78.0–100.0)
MONO ABS: 0.4 10*3/uL (ref 0.1–1.0)
MONOS PCT: 5.4 % (ref 3.0–12.0)
Neutro Abs: 4.4 10*3/uL (ref 1.4–7.7)
Neutrophils Relative %: 63.5 % (ref 43.0–77.0)
PLATELETS: 220 10*3/uL (ref 150.0–400.0)
RBC: 4.34 Mil/uL (ref 3.87–5.11)
RDW: 12.8 % (ref 11.5–15.5)
WBC: 7 10*3/uL (ref 4.0–10.5)

## 2014-07-04 LAB — HEPATIC FUNCTION PANEL
ALBUMIN: 4.1 g/dL (ref 3.5–5.2)
ALT: 18 U/L (ref 0–35)
AST: 25 U/L (ref 0–37)
Alkaline Phosphatase: 79 U/L (ref 39–117)
Bilirubin, Direct: 0 mg/dL (ref 0.0–0.3)
Total Bilirubin: 0.5 mg/dL (ref 0.2–1.2)
Total Protein: 7.4 g/dL (ref 6.0–8.3)

## 2014-07-04 LAB — LIPID PANEL
Cholesterol: 271 mg/dL — ABNORMAL HIGH (ref 0–200)
HDL: 39.9 mg/dL (ref >39.00)
NONHDL: 231.1
Total CHOL/HDL Ratio: 7
Triglycerides: 363 mg/dL — ABNORMAL HIGH (ref 0.0–149.0)
VLDL: 72.6 mg/dL — ABNORMAL HIGH (ref 0.0–40.0)

## 2014-07-04 LAB — TSH: TSH: 3.21 u[IU]/mL (ref 0.35–4.50)

## 2014-07-05 ENCOUNTER — Encounter: Payer: Self-pay | Admitting: Internal Medicine

## 2014-07-07 NOTE — Telephone Encounter (Signed)
Unread mychart message mailed to patient 

## 2014-07-20 ENCOUNTER — Encounter: Payer: Self-pay | Admitting: Internal Medicine

## 2014-07-21 ENCOUNTER — Encounter: Payer: Self-pay | Admitting: Internal Medicine

## 2014-07-21 NOTE — Telephone Encounter (Signed)
Letter printed and placed for signature

## 2014-08-02 ENCOUNTER — Other Ambulatory Visit: Payer: Self-pay | Admitting: Internal Medicine

## 2014-08-09 ENCOUNTER — Other Ambulatory Visit: Payer: Self-pay | Admitting: Internal Medicine

## 2014-10-06 ENCOUNTER — Encounter: Payer: BC Managed Care – PPO | Admitting: Internal Medicine

## 2014-10-13 ENCOUNTER — Encounter: Payer: Self-pay | Admitting: Internal Medicine

## 2014-10-21 ENCOUNTER — Ambulatory Visit (INDEPENDENT_AMBULATORY_CARE_PROVIDER_SITE_OTHER): Payer: BC Managed Care – PPO | Admitting: Internal Medicine

## 2014-10-21 ENCOUNTER — Encounter: Payer: Self-pay | Admitting: Internal Medicine

## 2014-10-21 VITALS — BP 120/78 | HR 80 | Temp 98.1°F | Ht 64.0 in | Wt 120.0 lb

## 2014-10-21 DIAGNOSIS — K209 Esophagitis, unspecified without bleeding: Secondary | ICD-10-CM

## 2014-10-21 DIAGNOSIS — Z23 Encounter for immunization: Secondary | ICD-10-CM | POA: Diagnosis not present

## 2014-10-21 DIAGNOSIS — E7800 Pure hypercholesterolemia, unspecified: Secondary | ICD-10-CM

## 2014-10-21 DIAGNOSIS — D649 Anemia, unspecified: Secondary | ICD-10-CM

## 2014-10-21 DIAGNOSIS — F418 Other specified anxiety disorders: Secondary | ICD-10-CM

## 2014-10-21 DIAGNOSIS — F502 Bulimia nervosa: Secondary | ICD-10-CM

## 2014-10-21 DIAGNOSIS — Z1239 Encounter for other screening for malignant neoplasm of breast: Secondary | ICD-10-CM | POA: Diagnosis not present

## 2014-10-21 DIAGNOSIS — R634 Abnormal weight loss: Secondary | ICD-10-CM

## 2014-10-21 DIAGNOSIS — Z0000 Encounter for general adult medical examination without abnormal findings: Secondary | ICD-10-CM

## 2014-10-21 DIAGNOSIS — Z86010 Personal history of colonic polyps: Secondary | ICD-10-CM

## 2014-10-21 NOTE — Progress Notes (Signed)
Pre-visit discussion using our clinic review tool. No additional management support is needed unless otherwise documented below in the visit note.  
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
## 2014-10-26 ENCOUNTER — Encounter: Payer: Self-pay | Admitting: Internal Medicine

## 2014-10-26 NOTE — Assessment & Plan Note (Signed)
Colonoscopy 05/21/12 - tubular adenomatous polyp - sigmoid colon.

## 2014-10-26 NOTE — Assessment & Plan Note (Signed)
Previous EGD with esophagitis and hiatal hernia.  On protonix.  Doing well.

## 2014-10-26 NOTE — Assessment & Plan Note (Signed)
Physical today.  PAP 06/19/13 - negative with negative HPV.  Mammogram 10/22/13 - Birads I.  Schedule f/u mammogram.  Colonoscopy 04/2012.

## 2014-10-26 NOTE — Assessment & Plan Note (Signed)
Discussed with her today.  Follow.

## 2014-10-26 NOTE — Assessment & Plan Note (Signed)
Has had colonoscopy and EGD.  Follow cbc.  hgb last checked 06/2014 - wnl.

## 2014-10-26 NOTE — Assessment & Plan Note (Signed)
Overall feels better.  Follow.

## 2014-10-26 NOTE — Assessment & Plan Note (Signed)
Continue celexa.  Sees GI.  Doing some better.  Discussed with her today.

## 2014-10-26 NOTE — Assessment & Plan Note (Signed)
Triglycerides improved.  Follow.  Lovaza.

## 2014-11-23 ENCOUNTER — Other Ambulatory Visit: Payer: Self-pay | Admitting: Internal Medicine

## 2014-11-26 ENCOUNTER — Ambulatory Visit
Admission: RE | Admit: 2014-11-26 | Discharge: 2014-11-26 | Disposition: A | Discharge status: Home / Self Care | Payer: BC Managed Care – PPO | Source: Ambulatory Visit | Attending: Internal Medicine | Admitting: Internal Medicine

## 2014-11-26 DIAGNOSIS — Z1231 Encounter for screening mammogram for malignant neoplasm of breast: Secondary | ICD-10-CM | POA: Diagnosis not present

## 2014-11-26 DIAGNOSIS — Z1239 Encounter for other screening for malignant neoplasm of breast: Secondary | ICD-10-CM

## 2015-01-29 ENCOUNTER — Telehealth: Payer: Self-pay | Admitting: Internal Medicine

## 2015-01-29 NOTE — Telephone Encounter (Signed)
[facility] [id number] called about medication imipramine (TOFRANIL) 25 MG tablet needing a prior auth from insurance, [id number]  ID # [id number]. Thank You!

## 2015-01-29 NOTE — Telephone Encounter (Signed)
This Rx looks like it was from historical provider. Pharmacy is looking for PA. Is patient taking this medication

## 2015-01-30 NOTE — Telephone Encounter (Signed)
Ok.  Let me know if I need to do anything.   

## 2015-01-30 NOTE — Telephone Encounter (Signed)
I have never prescribed this medication.  Please confirm with pt or pharmacy who has been previously refilling the medication and have them refill.   Thanks

## 2015-01-30 NOTE — Telephone Encounter (Signed)
Pharmacy sent in PA for wrong medication. Should have been for the Protonix. I told pharmacy to fax over form if still needed.

## 2015-03-03 ENCOUNTER — Encounter: Payer: Self-pay | Admitting: Internal Medicine

## 2015-03-03 DIAGNOSIS — N3010 Interstitial cystitis (chronic) without hematuria: Secondary | ICD-10-CM | POA: Insufficient documentation

## 2015-06-18 ENCOUNTER — Other Ambulatory Visit: Payer: Self-pay | Admitting: Internal Medicine

## 2015-07-12 ENCOUNTER — Encounter: Payer: Self-pay | Admitting: Internal Medicine

## 2015-07-13 NOTE — Telephone Encounter (Signed)
I can see her 07/29/15 at 12:30, but if she is having blurred vision and nausea, I recommend evaluation now (acute care today) and then can f/u here.

## 2015-07-22 ENCOUNTER — Encounter: Payer: Self-pay | Admitting: Internal Medicine

## 2015-07-24 ENCOUNTER — Encounter: Payer: Self-pay | Admitting: Internal Medicine

## 2015-07-24 NOTE — Telephone Encounter (Signed)
No fax number listed in email. Will sent pt a mychart message requesting one & will hold letter until Tuesday for signature.

## 2015-07-24 NOTE — Telephone Encounter (Signed)
It appears that Caryl Pina has already taken care of changing the appointment.  Please make sure pt aware.  The letter has been typed.  Will need to be faxed to attached number.  See her my chart number.

## 2015-07-29 ENCOUNTER — Ambulatory Visit: Payer: BC Managed Care – PPO | Admitting: Internal Medicine

## 2015-08-11 ENCOUNTER — Ambulatory Visit (INDEPENDENT_AMBULATORY_CARE_PROVIDER_SITE_OTHER): Payer: BC Managed Care – PPO | Admitting: Internal Medicine

## 2015-08-11 ENCOUNTER — Encounter: Payer: Self-pay | Admitting: Internal Medicine

## 2015-08-11 VITALS — BP 120/80 | HR 80 | Temp 97.9°F | Resp 17 | Ht 64.0 in | Wt 120.5 lb

## 2015-08-11 DIAGNOSIS — N644 Mastodynia: Secondary | ICD-10-CM | POA: Diagnosis not present

## 2015-08-11 DIAGNOSIS — D649 Anemia, unspecified: Secondary | ICD-10-CM | POA: Diagnosis not present

## 2015-08-11 DIAGNOSIS — F418 Other specified anxiety disorders: Secondary | ICD-10-CM

## 2015-08-11 DIAGNOSIS — R634 Abnormal weight loss: Secondary | ICD-10-CM

## 2015-08-11 DIAGNOSIS — F502 Bulimia nervosa, unspecified: Secondary | ICD-10-CM

## 2015-08-11 DIAGNOSIS — E7800 Pure hypercholesterolemia, unspecified: Secondary | ICD-10-CM | POA: Diagnosis not present

## 2015-08-11 DIAGNOSIS — K209 Esophagitis, unspecified without bleeding: Secondary | ICD-10-CM

## 2015-08-11 DIAGNOSIS — IMO0001 Reserved for inherently not codable concepts without codable children: Secondary | ICD-10-CM

## 2015-08-11 DIAGNOSIS — H538 Other visual disturbances: Secondary | ICD-10-CM

## 2015-08-11 DIAGNOSIS — R030 Elevated blood-pressure reading, without diagnosis of hypertension: Secondary | ICD-10-CM

## 2015-08-11 NOTE — Patient Instructions (Signed)
Cancer center - [phone]

## 2015-08-11 NOTE — Progress Notes (Signed)
Pre-visit discussion using our clinic review tool. No additional management support is needed unless otherwise documented below in the visit note.  
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
## 2015-08-12 ENCOUNTER — Telehealth: Payer: Self-pay | Admitting: Internal Medicine

## 2015-08-12 DIAGNOSIS — D649 Anemia, unspecified: Secondary | ICD-10-CM

## 2015-08-12 DIAGNOSIS — F502 Bulimia nervosa: Secondary | ICD-10-CM

## 2015-08-12 DIAGNOSIS — K209 Esophagitis, unspecified without bleeding: Secondary | ICD-10-CM

## 2015-08-12 DIAGNOSIS — E7800 Pure hypercholesterolemia, unspecified: Secondary | ICD-10-CM

## 2015-08-12 NOTE — Telephone Encounter (Signed)
Pt called about needing lab work. Need order please and thank you!  Call pt @ [phone]. Thank you!

## 2015-08-12 NOTE — Telephone Encounter (Signed)
I have placed the order for the labs.  She is supposed to come in tomorrow for lab appt.  She was supposed to stop on her way out yesterday and schedule fasting lab for tomorrow and f/u appt in 10 weeks.

## 2015-08-12 NOTE — Telephone Encounter (Signed)
Pt is scheduled for 06/14 @ 8am for fasting labs and 10 week follow up 09/15. Thank you!

## 2015-08-12 NOTE — Telephone Encounter (Signed)
Pt has orders from August-that's what she needs drawn I believe

## 2015-08-13 ENCOUNTER — Encounter: Payer: Self-pay | Admitting: Internal Medicine

## 2015-08-13 ENCOUNTER — Other Ambulatory Visit (INDEPENDENT_AMBULATORY_CARE_PROVIDER_SITE_OTHER): Payer: BC Managed Care – PPO

## 2015-08-13 DIAGNOSIS — D649 Anemia, unspecified: Secondary | ICD-10-CM | POA: Diagnosis not present

## 2015-08-13 DIAGNOSIS — R030 Elevated blood-pressure reading, without diagnosis of hypertension: Secondary | ICD-10-CM

## 2015-08-13 DIAGNOSIS — F502 Bulimia nervosa: Secondary | ICD-10-CM

## 2015-08-13 DIAGNOSIS — R7989 Other specified abnormal findings of blood chemistry: Secondary | ICD-10-CM

## 2015-08-13 DIAGNOSIS — IMO0001 Reserved for inherently not codable concepts without codable children: Secondary | ICD-10-CM | POA: Insufficient documentation

## 2015-08-13 DIAGNOSIS — E7800 Pure hypercholesterolemia, unspecified: Secondary | ICD-10-CM

## 2015-08-13 DIAGNOSIS — H538 Other visual disturbances: Secondary | ICD-10-CM | POA: Insufficient documentation

## 2015-08-13 LAB — LIPID PANEL
CHOL/HDL RATIO: 7
Cholesterol: 288 mg/dL — ABNORMAL HIGH (ref 0–200)
HDL: 39.5 mg/dL (ref >39.00)
NONHDL: 248.45
TRIGLYCERIDES: 226 mg/dL — AB (ref 0.0–149.0)
VLDL: 45.2 mg/dL — AB (ref 0.0–40.0)

## 2015-08-13 LAB — HEPATIC FUNCTION PANEL
ALBUMIN: 4.3 g/dL (ref 3.5–5.2)
ALK PHOS: 73 U/L (ref 39–117)
ALT: 16 U/L (ref 0–35)
AST: 23 U/L (ref 0–37)
Bilirubin, Direct: 0 mg/dL (ref 0.0–0.3)
TOTAL PROTEIN: 7.2 g/dL (ref 6.0–8.3)
Total Bilirubin: 0.6 mg/dL (ref 0.2–1.2)

## 2015-08-13 LAB — BASIC METABOLIC PANEL
BUN: 11 mg/dL (ref 6–23)
CHLORIDE: 105 meq/L (ref 96–112)
CO2: 31 mEq/L (ref 19–32)
CREATININE: 0.98 mg/dL (ref 0.40–1.20)
Calcium: 9.2 mg/dL (ref 8.4–10.5)
GFR: 62.18 mL/min (ref >60.00)
Glucose, Bld: 89 mg/dL (ref 70–99)
POTASSIUM: 3.4 meq/L — AB (ref 3.5–5.1)
SODIUM: 142 meq/L (ref 135–145)

## 2015-08-13 LAB — CBC WITH DIFFERENTIAL/PLATELET
BASOS ABS: 0 10*3/uL (ref 0.0–0.1)
Basophils Relative: 0.1 % (ref 0.0–3.0)
EOS ABS: 0 10*3/uL (ref 0.0–0.7)
Eosinophils Relative: 0.1 % (ref 0.0–5.0)
HEMATOCRIT: 36.5 % (ref 36.0–46.0)
HEMOGLOBIN: 12.4 g/dL (ref 12.0–15.0)
LYMPHS PCT: 28.9 % (ref 12.0–46.0)
Lymphs Abs: 1.6 10*3/uL (ref 0.7–4.0)
MCHC: 34 g/dL (ref 30.0–36.0)
MCV: 89.6 fl (ref 78.0–100.0)
MONOS PCT: 6.6 % (ref 3.0–12.0)
Monocytes Absolute: 0.4 10*3/uL (ref 0.1–1.0)
Neutro Abs: 3.6 10*3/uL (ref 1.4–7.7)
Neutrophils Relative %: 64.3 % (ref 43.0–77.0)
Platelets: 206 10*3/uL (ref 150.0–400.0)
RBC: 4.08 Mil/uL (ref 3.87–5.11)
RDW: 12.9 % (ref 11.5–15.5)
WBC: 5.6 10*3/uL (ref 4.0–10.5)

## 2015-08-13 LAB — TSH: TSH: 4.35 u[IU]/mL (ref 0.35–4.50)

## 2015-08-13 LAB — LDL CHOLESTEROL, DIRECT: LDL DIRECT: 178 mg/dL

## 2015-08-13 LAB — VITAMIN D 25 HYDROXY (VIT D DEFICIENCY, FRACTURES): VITD: 29.56 ng/mL — AB (ref 30.00–100.00)

## 2015-08-13 NOTE — Assessment & Plan Note (Signed)
Eating better.  Weight relatively stable.  Follow.

## 2015-08-13 NOTE — Assessment & Plan Note (Signed)
Previous EGD with esophagitis and hiatal hernia.  On protonix.  Symptoms controlled.  Some intermittent nausea.  Better.  Eating better.  Follow.

## 2015-08-13 NOTE — Assessment & Plan Note (Signed)
Blood pressure slightly elevated on check today - 140.  Will have her spot check her pressure.  Follow.  Hold on medication.

## 2015-08-13 NOTE — Assessment & Plan Note (Signed)
Both eyes.  No vision loss.  Will check routine labs including sugar.  Resolved now.  No headache.  Discussed further w/up.  Have opthalmology evaluated.  She wants to schedule appt.  Hold on scanning.  Follow.

## 2015-08-13 NOTE — Assessment & Plan Note (Signed)
On citalopram.  Stable.   

## 2015-08-13 NOTE — Assessment & Plan Note (Signed)
Low cholesterol diet.  Follow lipid panel.    

## 2015-08-13 NOTE — Assessment & Plan Note (Signed)
Has had colonoscopy and EGD.  Follow cbc.

## 2015-08-13 NOTE — Assessment & Plan Note (Signed)
Sees GI.  On citalopram.  Better now.  Follow.

## 2015-08-13 NOTE — Assessment & Plan Note (Signed)
Exam as outlined.  Persistent tenderness.  Obtain left breast mammogram and ultrasound.  Follow.

## 2015-08-14 ENCOUNTER — Encounter: Payer: Self-pay | Admitting: *Deleted

## 2015-08-14 ENCOUNTER — Other Ambulatory Visit: Payer: Self-pay | Admitting: Internal Medicine

## 2015-08-14 DIAGNOSIS — E876 Hypokalemia: Secondary | ICD-10-CM

## 2015-08-14 NOTE — Progress Notes (Signed)
Order placed for f/u potassium check.  

## 2015-08-24 ENCOUNTER — Other Ambulatory Visit (INDEPENDENT_AMBULATORY_CARE_PROVIDER_SITE_OTHER): Payer: BC Managed Care – PPO

## 2015-08-24 DIAGNOSIS — F502 Bulimia nervosa: Secondary | ICD-10-CM

## 2015-08-24 DIAGNOSIS — E7800 Pure hypercholesterolemia, unspecified: Secondary | ICD-10-CM

## 2015-08-24 DIAGNOSIS — R7989 Other specified abnormal findings of blood chemistry: Secondary | ICD-10-CM

## 2015-08-24 DIAGNOSIS — E876 Hypokalemia: Secondary | ICD-10-CM | POA: Diagnosis not present

## 2015-08-24 LAB — LIPID PANEL
CHOL/HDL RATIO: 8
CHOLESTEROL: 290 mg/dL — AB (ref 0–200)
HDL: 37.8 mg/dL — ABNORMAL LOW (ref >39.00)
NONHDL: 251.7
TRIGLYCERIDES: 255 mg/dL — AB (ref 0.0–149.0)
VLDL: 51 mg/dL — ABNORMAL HIGH (ref 0.0–40.0)

## 2015-08-24 LAB — BASIC METABOLIC PANEL
BUN: 11 mg/dL (ref 6–23)
CHLORIDE: 103 meq/L (ref 96–112)
CO2: 33 meq/L — AB (ref 19–32)
Calcium: 9.6 mg/dL (ref 8.4–10.5)
Creatinine, Ser: 1.03 mg/dL (ref 0.40–1.20)
GFR: 58.7 mL/min — AB (ref >60.00)
Glucose, Bld: 89 mg/dL (ref 70–99)
Potassium: 3.7 mEq/L (ref 3.5–5.1)
SODIUM: 140 meq/L (ref 135–145)

## 2015-08-24 LAB — HEPATIC FUNCTION PANEL
ALBUMIN: 4.3 g/dL (ref 3.5–5.2)
ALT: 17 U/L (ref 0–35)
AST: 22 U/L (ref 0–37)
Alkaline Phosphatase: 67 U/L (ref 39–117)
Bilirubin, Direct: 0 mg/dL (ref 0.0–0.3)
TOTAL PROTEIN: 7 g/dL (ref 6.0–8.3)
Total Bilirubin: 0.5 mg/dL (ref 0.2–1.2)

## 2015-08-24 LAB — LDL CHOLESTEROL, DIRECT: Direct LDL: 164 mg/dL

## 2015-08-24 NOTE — Addendum Note (Signed)
Addended by: Karlene Einstein D on: 08/24/2015 01:55 PM   Modules accepted: Orders

## 2015-08-25 ENCOUNTER — Other Ambulatory Visit: Payer: Self-pay | Admitting: Internal Medicine

## 2015-08-25 ENCOUNTER — Encounter: Payer: Self-pay | Admitting: *Deleted

## 2015-08-25 DIAGNOSIS — E7800 Pure hypercholesterolemia, unspecified: Secondary | ICD-10-CM

## 2015-08-25 NOTE — Progress Notes (Signed)
Order placed for f/u labs.  

## 2015-08-27 ENCOUNTER — Other Ambulatory Visit: Payer: BC Managed Care – PPO

## 2015-08-27 ENCOUNTER — Ambulatory Visit: Payer: BC Managed Care – PPO | Attending: Internal Medicine

## 2015-08-28 NOTE — Telephone Encounter (Signed)
Unread mychart message mailed to patient 

## 2015-09-14 ENCOUNTER — Encounter: Payer: Self-pay | Admitting: Internal Medicine

## 2015-11-10 ENCOUNTER — Encounter: Payer: Self-pay | Admitting: Internal Medicine

## 2015-11-10 ENCOUNTER — Ambulatory Visit (INDEPENDENT_AMBULATORY_CARE_PROVIDER_SITE_OTHER): Payer: BC Managed Care – PPO | Admitting: Internal Medicine

## 2015-11-10 VITALS — BP 120/74 | HR 94 | Temp 98.2°F | Ht 64.0 in | Wt 120.8 lb

## 2015-11-10 DIAGNOSIS — L989 Disorder of the skin and subcutaneous tissue, unspecified: Secondary | ICD-10-CM

## 2015-11-10 DIAGNOSIS — Z1239 Encounter for other screening for malignant neoplasm of breast: Secondary | ICD-10-CM

## 2015-11-10 DIAGNOSIS — E7800 Pure hypercholesterolemia, unspecified: Secondary | ICD-10-CM | POA: Diagnosis not present

## 2015-11-10 DIAGNOSIS — K209 Esophagitis, unspecified without bleeding: Secondary | ICD-10-CM

## 2015-11-10 DIAGNOSIS — Z86010 Personal history of colonic polyps: Secondary | ICD-10-CM

## 2015-11-10 DIAGNOSIS — H538 Other visual disturbances: Secondary | ICD-10-CM | POA: Diagnosis not present

## 2015-11-10 DIAGNOSIS — F418 Other specified anxiety disorders: Secondary | ICD-10-CM

## 2015-11-10 DIAGNOSIS — Z809 Family history of malignant neoplasm, unspecified: Secondary | ICD-10-CM

## 2015-11-10 DIAGNOSIS — L750 Bromhidrosis: Secondary | ICD-10-CM

## 2015-11-10 DIAGNOSIS — D649 Anemia, unspecified: Secondary | ICD-10-CM

## 2015-11-10 DIAGNOSIS — L749 Eccrine sweat disorder, unspecified: Secondary | ICD-10-CM

## 2015-11-10 LAB — URINALYSIS, ROUTINE W REFLEX MICROSCOPIC
BILIRUBIN URINE: NEGATIVE
Hgb urine dipstick: NEGATIVE
KETONES UR: NEGATIVE
Leukocytes, UA: NEGATIVE
Nitrite: NEGATIVE
PH: 7.5 (ref 5.0–8.0)
Specific Gravity, Urine: 1.01 (ref 1.000–1.030)
Total Protein, Urine: NEGATIVE
UROBILINOGEN UA: 0.2 (ref 0.0–1.0)
Urine Glucose: NEGATIVE
WBC UA: NONE SEEN (ref 0–?)

## 2015-11-10 NOTE — Progress Notes (Signed)
Pre visit review using our clinic review tool, if applicable. No additional management support is needed unless otherwise documented below in the visit note. 
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
## 2015-11-13 LAB — CULTURE, URINE COMPREHENSIVE

## 2015-11-15 ENCOUNTER — Encounter: Payer: Self-pay | Admitting: Internal Medicine

## 2015-11-15 ENCOUNTER — Telehealth: Payer: Self-pay | Admitting: Internal Medicine

## 2015-11-15 DIAGNOSIS — L989 Disorder of the skin and subcutaneous tissue, unspecified: Secondary | ICD-10-CM | POA: Insufficient documentation

## 2015-11-15 NOTE — Telephone Encounter (Signed)
My chart message sent to pt for update.   

## 2015-11-15 NOTE — Assessment & Plan Note (Signed)
On citalopram.  Stable.  Follow.  Doing better.

## 2015-11-15 NOTE — Assessment & Plan Note (Signed)
Low cholesterol diet and exercise.  Follow lipid panel.   

## 2015-11-15 NOTE — Assessment & Plan Note (Signed)
Follow cbc.  

## 2015-11-15 NOTE — Assessment & Plan Note (Signed)
Persistent lesion.  Refer to dermatology.  

## 2015-11-15 NOTE — Assessment & Plan Note (Signed)
On protonix.  Symptoms controlled.   

## 2015-11-15 NOTE — Assessment & Plan Note (Signed)
No vision change now.  Saw opthalmology.  No further w/up warranted.  Follow.

## 2015-11-15 NOTE — Assessment & Plan Note (Signed)
Colonoscopy 05/21/12 as outlined.  Follow.

## 2015-11-16 ENCOUNTER — Telehealth: Payer: Self-pay | Admitting: *Deleted

## 2015-11-16 MED ORDER — AMOXICILLIN 875 MG PO TABS
875.0000 mg | ORAL_TABLET | Freq: Two times a day (BID) | ORAL | 0 refills | Status: DC
Start: 2015-11-16 — End: 2015-12-14

## 2015-11-16 NOTE — Telephone Encounter (Signed)
Pt requested lab results  Pt contact [phone]

## 2015-11-16 NOTE — Telephone Encounter (Signed)
Order placed for amoxicillin rx.

## 2015-11-16 NOTE — Telephone Encounter (Signed)
Spoke with patient and gave lab results. Patient does not have allergy to pcn. She has tolerated amoxicillin in the past. Advised about taking the probiotic as well. Please send RX to pharmacy.

## 2015-11-17 ENCOUNTER — Telehealth: Payer: Self-pay

## 2015-11-17 NOTE — Telephone Encounter (Signed)
Pt is returning your call. Thank you!

## 2015-11-17 NOTE — Telephone Encounter (Signed)
-----   Message from Einar Pheasant, MD sent at 11/16/2015  5:14 AM EDT ----- Please notify pt that her urine culture was positive for infection.  Need to confirm that she is not allergic to pcn.  If no, then send in rx for amoxicillin 875mg  bid x 1 weeks.  She will need to take a probiotic while on antibiotics and for two weeks after completing antibiotics.

## 2015-11-17 NOTE — Telephone Encounter (Signed)
Left message to return call x 2

## 2015-11-28 ENCOUNTER — Encounter: Payer: Self-pay | Admitting: Internal Medicine

## 2015-11-28 DIAGNOSIS — R300 Dysuria: Secondary | ICD-10-CM

## 2015-12-01 ENCOUNTER — Ambulatory Visit: Payer: BC Managed Care – PPO | Admitting: Oncology

## 2015-12-07 NOTE — Telephone Encounter (Signed)
Please schedule and notify pt of lab appt for Tuesday pm.  I will place the order.  See her my chart message.  Thanks    Dr Nicki Reaper

## 2015-12-08 ENCOUNTER — Other Ambulatory Visit: Payer: BC Managed Care – PPO

## 2015-12-08 ENCOUNTER — Encounter: Payer: Self-pay | Admitting: Internal Medicine

## 2015-12-08 NOTE — Telephone Encounter (Signed)
I am ok if pt comes in at 4:30 to leave urine sample if it is ok for lab to be scheduled at this time.  Order already placed.

## 2015-12-11 ENCOUNTER — Other Ambulatory Visit: Payer: BC Managed Care – PPO

## 2015-12-11 DIAGNOSIS — R300 Dysuria: Secondary | ICD-10-CM

## 2015-12-11 NOTE — Telephone Encounter (Signed)
FYI: Pt came in this afternoon & gave a urine & was not aware that labs were needed. Fasting labs were ordered last month & it was noted that patient was coming in for labs & urine. Pt only gave urine today & I asked her to make a lab appt for fasting labs. No lab appt was made before leaving.

## 2015-12-11 NOTE — Telephone Encounter (Addendum)
Is this pt aware that she has fasting labs scheduled? She was scheduled today @ 4:30 (we also switch to [place] by then)

## 2015-12-11 NOTE — Telephone Encounter (Signed)
Please schedule her for fasting labs.  Thanks.

## 2015-12-12 LAB — URINALYSIS, ROUTINE W REFLEX MICROSCOPIC
BILIRUBIN URINE: NEGATIVE
GLUCOSE, UA: NEGATIVE
Hgb urine dipstick: NEGATIVE
Ketones, ur: NEGATIVE
LEUKOCYTES UA: NEGATIVE
Nitrite: NEGATIVE
PH: 7.5 (ref 5.0–8.0)
PROTEIN: NEGATIVE
SPECIFIC GRAVITY, URINE: 1.016 (ref 1.001–1.035)

## 2015-12-12 LAB — URINALYSIS, MICROSCOPIC ONLY
Bacteria, UA: NONE SEEN [HPF]
CASTS: NONE SEEN [LPF]
Crystals: NONE SEEN [HPF]
SQUAMOUS EPITHELIAL / LPF: NONE SEEN [HPF] (ref <=5)
WBC, UA: NONE SEEN WBC/HPF (ref <=5)
YEAST: NONE SEEN [HPF]

## 2015-12-13 ENCOUNTER — Encounter: Payer: Self-pay | Admitting: Internal Medicine

## 2015-12-13 DIAGNOSIS — Z1379 Encounter for other screening for genetic and chromosomal anomalies: Secondary | ICD-10-CM | POA: Insufficient documentation

## 2015-12-13 LAB — CULTURE, URINE COMPREHENSIVE: Organism ID, Bacteria: NO GROWTH
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
## 2015-12-14 ENCOUNTER — Inpatient Hospital Stay: Payer: BC Managed Care – PPO | Attending: Oncology | Admitting: Oncology

## 2015-12-14 ENCOUNTER — Encounter: Payer: Self-pay | Admitting: Oncology

## 2015-12-14 ENCOUNTER — Ambulatory Visit: Payer: BC Managed Care – PPO | Admitting: Oncology

## 2015-12-14 DIAGNOSIS — N3090 Cystitis, unspecified without hematuria: Secondary | ICD-10-CM

## 2015-12-14 DIAGNOSIS — Z803 Family history of malignant neoplasm of breast: Secondary | ICD-10-CM | POA: Insufficient documentation

## 2015-12-14 DIAGNOSIS — Z801 Family history of malignant neoplasm of trachea, bronchus and lung: Secondary | ICD-10-CM | POA: Diagnosis not present

## 2015-12-14 DIAGNOSIS — K589 Irritable bowel syndrome without diarrhea: Secondary | ICD-10-CM | POA: Insufficient documentation

## 2015-12-14 DIAGNOSIS — N3281 Overactive bladder: Secondary | ICD-10-CM

## 2015-12-14 DIAGNOSIS — Z79899 Other long term (current) drug therapy: Secondary | ICD-10-CM | POA: Insufficient documentation

## 2015-12-14 DIAGNOSIS — K219 Gastro-esophageal reflux disease without esophagitis: Secondary | ICD-10-CM | POA: Diagnosis not present

## 2015-12-14 DIAGNOSIS — F419 Anxiety disorder, unspecified: Secondary | ICD-10-CM | POA: Diagnosis not present

## 2015-12-14 DIAGNOSIS — Z1379 Encounter for other screening for genetic and chromosomal anomalies: Secondary | ICD-10-CM | POA: Insufficient documentation

## 2015-12-14 DIAGNOSIS — E785 Hyperlipidemia, unspecified: Secondary | ICD-10-CM | POA: Insufficient documentation

## 2015-12-14 DIAGNOSIS — Z800 Family history of malignant neoplasm of digestive organs: Secondary | ICD-10-CM

## 2015-12-14 DIAGNOSIS — Z87828 Personal history of other (healed) physical injury and trauma: Secondary | ICD-10-CM | POA: Insufficient documentation

## 2015-12-14 NOTE — Progress Notes (Signed)
New evaluation for genetic testing. Offers no complaints. States is feeling well.

## 2015-12-15 NOTE — Telephone Encounter (Signed)
The labs are ordered and in the system.  Per her note, she wants a fasting lab appt on Friday 12/18/15.  Please schedule and notify pt.  This is for cholesterol and liver and metabolic panel.  Thanks.    Dr Nicki Reaper

## 2015-12-16 ENCOUNTER — Other Ambulatory Visit: Payer: Self-pay | Admitting: Internal Medicine

## 2015-12-16 ENCOUNTER — Ambulatory Visit
Admission: RE | Admit: 2015-12-16 | Discharge: 2015-12-16 | Disposition: A | Discharge status: Home / Self Care | Payer: BC Managed Care – PPO | Source: Ambulatory Visit | Attending: Internal Medicine | Admitting: Internal Medicine

## 2015-12-16 DIAGNOSIS — Z1231 Encounter for screening mammogram for malignant neoplasm of breast: Secondary | ICD-10-CM | POA: Diagnosis not present

## 2015-12-16 DIAGNOSIS — Z1239 Encounter for other screening for malignant neoplasm of breast: Secondary | ICD-10-CM

## 2015-12-18 ENCOUNTER — Other Ambulatory Visit: Payer: Self-pay | Admitting: Internal Medicine

## 2015-12-18 ENCOUNTER — Other Ambulatory Visit (INDEPENDENT_AMBULATORY_CARE_PROVIDER_SITE_OTHER): Payer: BC Managed Care – PPO

## 2015-12-18 DIAGNOSIS — E7800 Pure hypercholesterolemia, unspecified: Secondary | ICD-10-CM

## 2015-12-18 DIAGNOSIS — R928 Other abnormal and inconclusive findings on diagnostic imaging of breast: Secondary | ICD-10-CM

## 2015-12-18 LAB — LIPID PANEL
Cholesterol: 271 mg/dL — ABNORMAL HIGH (ref 0–200)
HDL: 35 mg/dL — AB (ref >39.00)
NONHDL: 236
Total CHOL/HDL Ratio: 8
Triglycerides: 254 mg/dL — ABNORMAL HIGH (ref 0.0–149.0)
VLDL: 50.8 mg/dL — AB (ref 0.0–40.0)

## 2015-12-18 LAB — HEPATIC FUNCTION PANEL
ALT: 17 U/L (ref 0–35)
AST: 23 U/L (ref 0–37)
Albumin: 4.4 g/dL (ref 3.5–5.2)
Alkaline Phosphatase: 70 U/L (ref 39–117)
BILIRUBIN DIRECT: 0 mg/dL (ref 0.0–0.3)
BILIRUBIN TOTAL: 0.6 mg/dL (ref 0.2–1.2)
Total Protein: 7.1 g/dL (ref 6.0–8.3)

## 2015-12-18 LAB — BASIC METABOLIC PANEL
BUN: 13 mg/dL (ref 6–23)
CHLORIDE: 103 meq/L (ref 96–112)
CO2: 32 meq/L (ref 19–32)
Calcium: 9.4 mg/dL (ref 8.4–10.5)
Creatinine, Ser: 1.05 mg/dL (ref 0.40–1.20)
GFR: 57.35 mL/min — ABNORMAL LOW (ref >60.00)
GLUCOSE: 85 mg/dL (ref 70–99)
POTASSIUM: 3.5 meq/L (ref 3.5–5.1)
SODIUM: 141 meq/L (ref 135–145)

## 2015-12-18 LAB — LDL CHOLESTEROL, DIRECT: Direct LDL: 158 mg/dL

## 2015-12-18 NOTE — Progress Notes (Signed)
Order placed for f/u left breast mammogram and ultrasound.   

## 2015-12-22 ENCOUNTER — Telehealth: Payer: Self-pay | Admitting: *Deleted

## 2015-12-22 NOTE — Telephone Encounter (Signed)
Patient requested lab results  Pt contact [phone]

## 2015-12-23 NOTE — Telephone Encounter (Signed)
Spoke with patient, see result note .thanks

## 2015-12-23 NOTE — Telephone Encounter (Signed)
Pt returned call. Stated that you may leave a message on her voicemail.  Call pt @ [phone]

## 2015-12-30 MED ORDER — EZETIMIBE 10 MG PO TABS: 10.0000 mg | ORAL_TABLET | Freq: Every day | ORAL | 2 refills | Status: DC

## 2015-12-30 NOTE — Telephone Encounter (Signed)
Patient would like the Zetia called into [facility] in [place] *check lab notes for reference

## 2015-12-30 NOTE — Telephone Encounter (Signed)
[facility]

## 2015-12-30 NOTE — Telephone Encounter (Signed)
Ordered and sent to pharmacy, did she schedule labs in 6 weeks?

## 2016-01-01 ENCOUNTER — Other Ambulatory Visit: Payer: Self-pay | Admitting: Internal Medicine

## 2016-01-04 ENCOUNTER — Ambulatory Visit
Admission: RE | Admit: 2016-01-04 | Discharge: 2016-01-04 | Disposition: A | Discharge status: Home / Self Care | Payer: BC Managed Care – PPO | Source: Ambulatory Visit | Attending: Internal Medicine | Admitting: Internal Medicine

## 2016-01-04 DIAGNOSIS — R928 Other abnormal and inconclusive findings on diagnostic imaging of breast: Secondary | ICD-10-CM | POA: Diagnosis not present

## 2016-02-08 ENCOUNTER — Other Ambulatory Visit: Payer: Self-pay | Admitting: Internal Medicine

## 2016-03-10 ENCOUNTER — Encounter: Payer: Self-pay | Admitting: Internal Medicine

## 2016-03-10 NOTE — Telephone Encounter (Signed)
I can see her Monday at 11:00.

## 2016-03-14 ENCOUNTER — Ambulatory Visit (INDEPENDENT_AMBULATORY_CARE_PROVIDER_SITE_OTHER): Payer: BC Managed Care – PPO | Admitting: Internal Medicine

## 2016-03-14 ENCOUNTER — Encounter: Payer: Self-pay | Admitting: Internal Medicine

## 2016-03-14 VITALS — BP 122/84 | HR 88 | Temp 97.9°F | Ht 64.0 in | Wt 119.0 lb

## 2016-03-14 DIAGNOSIS — N3010 Interstitial cystitis (chronic) without hematuria: Secondary | ICD-10-CM

## 2016-03-14 DIAGNOSIS — E7800 Pure hypercholesterolemia, unspecified: Secondary | ICD-10-CM

## 2016-03-14 DIAGNOSIS — R634 Abnormal weight loss: Secondary | ICD-10-CM | POA: Diagnosis not present

## 2016-03-14 DIAGNOSIS — L299 Pruritus, unspecified: Secondary | ICD-10-CM

## 2016-03-14 DIAGNOSIS — F418 Other specified anxiety disorders: Secondary | ICD-10-CM | POA: Diagnosis not present

## 2016-03-14 DIAGNOSIS — R350 Frequency of micturition: Secondary | ICD-10-CM

## 2016-03-14 DIAGNOSIS — F502 Bulimia nervosa: Secondary | ICD-10-CM

## 2016-03-14 LAB — URINALYSIS, ROUTINE W REFLEX MICROSCOPIC
Bilirubin Urine: NEGATIVE
Ketones, ur: NEGATIVE
Leukocytes, UA: NEGATIVE
Nitrite: NEGATIVE
SPECIFIC GRAVITY, URINE: 1.015 (ref 1.000–1.030)
Total Protein, Urine: NEGATIVE
Urine Glucose: NEGATIVE
Urobilinogen, UA: 0.2 (ref 0.0–1.0)
WBC UA: NONE SEEN (ref 0–?)
pH: 7 (ref 5.0–8.0)

## 2016-03-14 LAB — CBC WITH DIFFERENTIAL/PLATELET
BASOS ABS: 0 10*3/uL (ref 0.0–0.1)
Basophils Relative: 0.1 % (ref 0.0–3.0)
Eosinophils Absolute: 0 10*3/uL (ref 0.0–0.7)
Eosinophils Relative: 0.1 % (ref 0.0–5.0)
HCT: 36.5 % (ref 36.0–46.0)
Hemoglobin: 12.7 g/dL (ref 12.0–15.0)
LYMPHS ABS: 1.7 10*3/uL (ref 0.7–4.0)
LYMPHS PCT: 23.4 % (ref 12.0–46.0)
MCHC: 34.9 g/dL (ref 30.0–36.0)
MCV: 89.1 fl (ref 78.0–100.0)
MONOS PCT: 5.5 % (ref 3.0–12.0)
Monocytes Absolute: 0.4 10*3/uL (ref 0.1–1.0)
NEUTROS PCT: 70.9 % (ref 43.0–77.0)
Neutro Abs: 5.2 10*3/uL (ref 1.4–7.7)
Platelets: 208 10*3/uL (ref 150.0–400.0)
RBC: 4.09 Mil/uL (ref 3.87–5.11)
RDW: 12.6 % (ref 11.5–15.5)
WBC: 7.3 10*3/uL (ref 4.0–10.5)

## 2016-03-14 LAB — HEPATIC FUNCTION PANEL
ALBUMIN: 4.2 g/dL (ref 3.5–5.2)
ALK PHOS: 76 U/L (ref 39–117)
ALT: 12 U/L (ref 0–35)
AST: 17 U/L (ref 0–37)
Bilirubin, Direct: 0.1 mg/dL (ref 0.0–0.3)
Total Bilirubin: 0.4 mg/dL (ref 0.2–1.2)
Total Protein: 6.8 g/dL (ref 6.0–8.3)

## 2016-03-14 LAB — BASIC METABOLIC PANEL
BUN: 14 mg/dL (ref 6–23)
CHLORIDE: 104 meq/L (ref 96–112)
CO2: 30 meq/L (ref 19–32)
CREATININE: 0.99 mg/dL (ref 0.40–1.20)
Calcium: 9.3 mg/dL (ref 8.4–10.5)
GFR: 61.33 mL/min (ref >60.00)
Glucose, Bld: 66 mg/dL — ABNORMAL LOW (ref 70–99)
Potassium: 3.6 mEq/L (ref 3.5–5.1)
Sodium: 140 mEq/L (ref 135–145)

## 2016-03-14 LAB — TSH: TSH: 3.86 u[IU]/mL (ref 0.35–4.50)

## 2016-03-14 NOTE — Progress Notes (Signed)
Pre visit review using our clinic review tool, if applicable. No additional management support is needed unless otherwise documented below in the visit note. 
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
## 2016-03-15 ENCOUNTER — Encounter: Payer: Self-pay | Admitting: Internal Medicine

## 2016-03-16 ENCOUNTER — Encounter: Payer: Self-pay | Admitting: Internal Medicine

## 2016-03-16 LAB — CULTURE, URINE COMPREHENSIVE: Organism ID, Bacteria: NO GROWTH

## 2016-03-16 NOTE — Assessment & Plan Note (Signed)
Sees Dr Jacqlyn Larsen.  Has f/u scheduled.  Stable.

## 2016-03-16 NOTE — Assessment & Plan Note (Signed)
Has lost some weight.  Overall she feels her GI symptoms are stable.  Will follow.

## 2016-03-16 NOTE — Assessment & Plan Note (Signed)
On citalopram.  Stable.  Follow.   

## 2016-03-16 NOTE — Assessment & Plan Note (Signed)
On citalopram.  Followed by GI.

## 2016-03-16 NOTE — Assessment & Plan Note (Signed)
Discussed cholesterol medication.  She had problems with crestor and lipitor.  Does not want to try another statin medication.  On zetia and tolerating.  Follow lipid panel.

## 2016-03-18 ENCOUNTER — Encounter: Payer: Self-pay | Admitting: Internal Medicine

## 2016-06-06 ENCOUNTER — Encounter: Payer: Self-pay | Admitting: Family

## 2016-06-06 ENCOUNTER — Ambulatory Visit (INDEPENDENT_AMBULATORY_CARE_PROVIDER_SITE_OTHER): Payer: BC Managed Care – PPO | Admitting: Family

## 2016-06-06 VITALS — BP 150/85 | HR 83 | Temp 98.2°F | Ht 64.0 in | Wt 120.8 lb

## 2016-06-06 DIAGNOSIS — K047 Periapical abscess without sinus: Secondary | ICD-10-CM

## 2016-06-06 MED ORDER — AMOXICILLIN-POT CLAVULANATE ER 1000-62.5 MG PO TB12: 2.0000 | ORAL_TABLET | Freq: Two times a day (BID) | ORAL | 0 refills | Status: AC

## 2016-06-06 MED ORDER — AMOXICILLIN-POT CLAVULANATE ER 1000-62.5 MG PO TB12: 2.0000 | ORAL_TABLET | Freq: Two times a day (BID) | ORAL | 0 refills | Status: DC

## 2016-06-06 NOTE — Progress Notes (Signed)
Pre visit review using our clinic review tool, if applicable. No additional management support is needed unless otherwise documented below in the visit note. 
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
## 2016-06-06 NOTE — Addendum Note (Signed)
Addended by: Elpidio Galea T on: 06/06/2016 04:40 PM   Modules accepted: Orders

## 2016-06-06 NOTE — Patient Instructions (Signed)
As discussed, please remain very vigilant as concerned for dental abscess which have serious complications.   If pain, swelling increases, please go to emergency room as quickest way to see a dentist.  Probiotics while on antibiotic and for 2 weeks after  If there is no improvement in your symptoms, or if there is any worsening of symptoms, or if you have any additional concerns, please return for re-evaluation; or, if we are closed, consider going to the Emergency Room for evaluation if symptoms urgent.

## 2016-06-08 ENCOUNTER — Telehealth: Payer: Self-pay | Admitting: *Deleted

## 2016-06-08 NOTE — Telephone Encounter (Signed)
Called patient back and advised her to go to urgent care and be evaluated.  Confirmed with patient there could be no chance of bruising she denied.  She will go to [facility] for evaluation today.  Please route message back to me and I will call patient back tomorrow to follow up. Thanks.

## 2016-06-08 NOTE — Telephone Encounter (Signed)
Patient has requested to see Dr. Nicki Reaper only on Friday around 4 pm. Pt has currently has Blue discoloration on her upper lip, pt has concerns that this could be a heart disease symptom, suggested by her friend.  Please contact pt at [phone]

## 2016-06-08 NOTE — Telephone Encounter (Signed)
Let her know that I am not in the office this pm and that I do feel she needs to be seen today just to confirm etiology.

## 2016-06-08 NOTE — Telephone Encounter (Signed)
Noted. Thanks.

## 2016-06-08 NOTE — Telephone Encounter (Signed)
Reason for call:blue discoloration above upper lip location Symptoms:jaw pain last week , no shortness of breath, no shortness of breath with walking, currently being treated for infection in left jaw, no arm numbness, tiredness, breathing fine speaking clearly  Duration last week  Medications: Amoxicillin Last seen for this problem:for dental abscess 06/06/16 Seen by:Saw Louis Meckel RN suggested she contact PCP office  Family history of cardiac problems , O2 stats when saw Joycelyn Schmid on 06/06/16 98% BP 140/85  I urged patient to go to urgent care for work up . Wants your advice first.  Please advise

## 2016-06-09 ENCOUNTER — Encounter: Payer: Self-pay | Admitting: Internal Medicine

## 2016-06-10 NOTE — Telephone Encounter (Addendum)
Spoke with patient and she went to urgent care EKG was abnormal and they want her to do a stress test.  Patient has EKG . Please advise.

## 2016-06-10 NOTE — Telephone Encounter (Signed)
Patient advised of below . Patient advised if she has any acute symptoms she is to be seen in ED patient verbalized an understanding.    Linda Perez , Dr Nicki Reaper wants patient worked in for appointment in work in spot . Thanks

## 2016-06-10 NOTE — Telephone Encounter (Signed)
Please let pt know that we are trying to get a copy of her ekg from [place].  Unable to get at this time.  We will find a work in spot for her, but if she has any acute symptoms - she needs to be seen.   Please forward to [place] for work in spot.  Thanks.

## 2016-06-10 NOTE — Telephone Encounter (Signed)
Spoke with Emergency planning/management officer at [facility] in [facility] she will fax EKG to [place] today.  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

## 2016-06-10 NOTE — Telephone Encounter (Signed)
Reviewed acute care note, but I am unable to pull up the ekg.  I need a copy of the ekg to compare to her last ekg.  The note stated, needed to compare to make sure no change.

## 2016-06-11 ENCOUNTER — Other Ambulatory Visit: Payer: Self-pay | Admitting: Internal Medicine

## 2016-06-12 NOTE — Telephone Encounter (Signed)
See phone message for response.

## 2016-06-13 ENCOUNTER — Telehealth: Payer: Self-pay | Admitting: *Deleted

## 2016-06-13 NOTE — Telephone Encounter (Signed)
Linda Perez from [facility] stated that the EKG for this patient has been faxed to this office .

## 2016-06-13 NOTE — Telephone Encounter (Signed)
Called pt to schedule appt for 06/14/16 @ 4pm.. Pt stated that she had a meeting and that was more important than a Doctors appt this week

## 2016-06-13 NOTE — Telephone Encounter (Signed)
Waiting for cancellation

## 2016-06-13 NOTE — Telephone Encounter (Signed)
Received put on Dr. Nicki Reaper desk for review.

## 2016-06-14 NOTE — Telephone Encounter (Signed)
I can see her at 12:30 on 06/29/16.  Please hold out for earlier cancellation.  Let us know if any problems.    Dr Nicki Reaper

## 2016-06-14 NOTE — Telephone Encounter (Signed)
Pt scheduled  

## 2016-06-29 ENCOUNTER — Ambulatory Visit (INDEPENDENT_AMBULATORY_CARE_PROVIDER_SITE_OTHER): Payer: BC Managed Care – PPO

## 2016-06-29 ENCOUNTER — Encounter: Payer: Self-pay | Admitting: Internal Medicine

## 2016-06-29 ENCOUNTER — Ambulatory Visit (INDEPENDENT_AMBULATORY_CARE_PROVIDER_SITE_OTHER): Payer: BC Managed Care – PPO | Admitting: Internal Medicine

## 2016-06-29 VITALS — BP 130/74 | HR 86 | Temp 98.1°F | Resp 14 | Wt 118.2 lb

## 2016-06-29 DIAGNOSIS — N3010 Interstitial cystitis (chronic) without hematuria: Secondary | ICD-10-CM | POA: Diagnosis not present

## 2016-06-29 DIAGNOSIS — R221 Localized swelling, mass and lump, neck: Secondary | ICD-10-CM

## 2016-06-29 DIAGNOSIS — E7800 Pure hypercholesterolemia, unspecified: Secondary | ICD-10-CM | POA: Diagnosis not present

## 2016-06-29 DIAGNOSIS — R0602 Shortness of breath: Secondary | ICD-10-CM

## 2016-06-29 DIAGNOSIS — K209 Esophagitis, unspecified without bleeding: Secondary | ICD-10-CM

## 2016-06-29 DIAGNOSIS — F502 Bulimia nervosa: Secondary | ICD-10-CM

## 2016-06-29 MED ORDER — PANTOPRAZOLE SODIUM 40 MG PO TBEC: 40.0000 mg | DELAYED_RELEASE_TABLET | Freq: Every day | ORAL | 2 refills | Status: DC
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
## 2016-06-29 NOTE — Progress Notes (Signed)
Pre visit review using our clinic review tool, if applicable. No additional management support is needed unless otherwise documented below in the visit note. 

## 2016-06-30 ENCOUNTER — Encounter: Payer: Self-pay | Admitting: Internal Medicine

## 2016-07-01 ENCOUNTER — Encounter: Payer: Self-pay | Admitting: Internal Medicine

## 2016-07-03 NOTE — Assessment & Plan Note (Signed)
Followed by Dr Cope.  

## 2016-07-03 NOTE — Assessment & Plan Note (Signed)
Increased acid reflux.  protonix - increase to 40mg  q day.  Follow.  Get her back in soon to reassess.

## 2016-07-03 NOTE — Assessment & Plan Note (Signed)
EKG obtained at acute care as outlined.  New intraventricular block when compared to previous EKG.  Will have cardiology evaluate for question of need for further w/up.

## 2016-07-03 NOTE — Assessment & Plan Note (Signed)
Worsened recently.  Increased stress.  On citalopram.  Overall she feels things are starting to level off.  Follow.  Has seen GI.

## 2016-07-03 NOTE — Assessment & Plan Note (Signed)
Low cholesterol diet and exercise.  Follow lipid panel.   

## 2016-07-11 ENCOUNTER — Encounter: Payer: Self-pay | Admitting: Internal Medicine

## 2016-07-13 NOTE — Telephone Encounter (Signed)
See this pts my chart message.  She was inquiring about a sleep study.  Prefers to do a home study.  Do you know if a home study is something that can be done and if so, what do I need to do to get this scheduled?  Just let me know and also let me know if I need to order a regular sleep study at the sleep lab.   Thanks

## 2016-08-05 ENCOUNTER — Other Ambulatory Visit: Payer: Self-pay | Admitting: Internal Medicine

## 2016-08-09 ENCOUNTER — Other Ambulatory Visit: Payer: Self-pay | Admitting: Internal Medicine

## 2016-08-10 MED ORDER — CITALOPRAM HYDROBROMIDE 10 MG PO TABS: 10.0000 mg | ORAL_TABLET | Freq: Every day | ORAL | 0 refills | Status: DC

## 2016-11-07 ENCOUNTER — Other Ambulatory Visit: Payer: Self-pay | Admitting: Internal Medicine

## 2016-11-09 ENCOUNTER — Other Ambulatory Visit: Payer: Self-pay | Admitting: Internal Medicine

## 2016-11-23 ENCOUNTER — Encounter: Payer: Self-pay | Admitting: Internal Medicine

## 2016-11-24 NOTE — Telephone Encounter (Signed)
Paint with me.  I am not aware of anyone holding this spot.  Can confirm with Caryl Pina and [place].

## 2016-11-24 NOTE — Telephone Encounter (Signed)
She needs an appt for an evaluation of her neck, to know try to determine etiology and what test warranted.  Also can schedule cpe.

## 2016-12-02 ENCOUNTER — Encounter: Payer: Self-pay | Admitting: Internal Medicine

## 2016-12-05 NOTE — Telephone Encounter (Signed)
Linda Perez, can you put her in the 8:00 spot on Friday.  Thanks  See my staff message to you.

## 2016-12-08 ENCOUNTER — Ambulatory Visit: Payer: BC Managed Care – PPO | Admitting: Internal Medicine

## 2016-12-09 ENCOUNTER — Encounter: Payer: Self-pay | Admitting: Internal Medicine

## 2016-12-09 ENCOUNTER — Ambulatory Visit (INDEPENDENT_AMBULATORY_CARE_PROVIDER_SITE_OTHER): Payer: BC Managed Care – PPO

## 2016-12-09 ENCOUNTER — Other Ambulatory Visit (HOSPITAL_COMMUNITY)
Admission: RE | Admit: 2016-12-09 | Discharge: 2016-12-09 | Disposition: A | Discharge status: Home / Self Care | Payer: BC Managed Care – PPO | Source: Ambulatory Visit | Attending: Internal Medicine | Admitting: Internal Medicine

## 2016-12-09 ENCOUNTER — Ambulatory Visit (INDEPENDENT_AMBULATORY_CARE_PROVIDER_SITE_OTHER): Payer: BC Managed Care – PPO | Admitting: Internal Medicine

## 2016-12-09 VITALS — BP 130/80 | HR 86 | Temp 98.0°F | Resp 14 | Ht 64.0 in | Wt 115.2 lb

## 2016-12-09 DIAGNOSIS — F418 Other specified anxiety disorders: Secondary | ICD-10-CM | POA: Diagnosis not present

## 2016-12-09 DIAGNOSIS — M542 Cervicalgia: Secondary | ICD-10-CM

## 2016-12-09 DIAGNOSIS — Z124 Encounter for screening for malignant neoplasm of cervix: Secondary | ICD-10-CM | POA: Insufficient documentation

## 2016-12-09 DIAGNOSIS — Z1231 Encounter for screening mammogram for malignant neoplasm of breast: Secondary | ICD-10-CM

## 2016-12-09 DIAGNOSIS — K209 Esophagitis, unspecified without bleeding: Secondary | ICD-10-CM

## 2016-12-09 DIAGNOSIS — Z23 Encounter for immunization: Secondary | ICD-10-CM

## 2016-12-09 DIAGNOSIS — Z1211 Encounter for screening for malignant neoplasm of colon: Secondary | ICD-10-CM

## 2016-12-09 DIAGNOSIS — E7800 Pure hypercholesterolemia, unspecified: Secondary | ICD-10-CM | POA: Diagnosis not present

## 2016-12-09 DIAGNOSIS — Z0000 Encounter for general adult medical examination without abnormal findings: Secondary | ICD-10-CM

## 2016-12-09 DIAGNOSIS — Z86010 Personal history of colonic polyps: Secondary | ICD-10-CM

## 2016-12-09 DIAGNOSIS — R634 Abnormal weight loss: Secondary | ICD-10-CM | POA: Diagnosis not present

## 2016-12-09 DIAGNOSIS — F502 Bulimia nervosa: Secondary | ICD-10-CM

## 2016-12-09 DIAGNOSIS — R350 Frequency of micturition: Secondary | ICD-10-CM

## 2016-12-09 DIAGNOSIS — Z1239 Encounter for other screening for malignant neoplasm of breast: Secondary | ICD-10-CM

## 2016-12-09 LAB — URINALYSIS, ROUTINE W REFLEX MICROSCOPIC
BILIRUBIN URINE: NEGATIVE
Hgb urine dipstick: NEGATIVE
Ketones, ur: NEGATIVE
LEUKOCYTES UA: NEGATIVE
Nitrite: NEGATIVE
PH: 8 (ref 5.0–8.0)
RBC / HPF: NONE SEEN (ref 0–?)
SPECIFIC GRAVITY, URINE: 1.01 (ref 1.000–1.030)
Total Protein, Urine: NEGATIVE
Urine Glucose: NEGATIVE
Urobilinogen, UA: 0.2 (ref 0.0–1.0)

## 2016-12-09 LAB — CBC WITH DIFFERENTIAL/PLATELET
BASOS PCT: 0.2 % (ref 0.0–3.0)
Basophils Absolute: 0 10*3/uL (ref 0.0–0.1)
EOS ABS: 0 10*3/uL (ref 0.0–0.7)
Eosinophils Relative: 0.2 % (ref 0.0–5.0)
HCT: 40.2 % (ref 36.0–46.0)
Hemoglobin: 13.8 g/dL (ref 12.0–15.0)
LYMPHS ABS: 1.8 10*3/uL (ref 0.7–4.0)
Lymphocytes Relative: 31 % (ref 12.0–46.0)
MCHC: 34.4 g/dL (ref 30.0–36.0)
MCV: 92 fl (ref 78.0–100.0)
MONO ABS: 0.4 10*3/uL (ref 0.1–1.0)
Monocytes Relative: 6.3 % (ref 3.0–12.0)
Neutro Abs: 3.6 10*3/uL (ref 1.4–7.7)
Neutrophils Relative %: 62.3 % (ref 43.0–77.0)
Platelets: 211 10*3/uL (ref 150.0–400.0)
RBC: 4.36 Mil/uL (ref 3.87–5.11)
RDW: 13 % (ref 11.5–15.5)
WBC: 5.9 10*3/uL (ref 4.0–10.5)

## 2016-12-09 LAB — BASIC METABOLIC PANEL
BUN: 11 mg/dL (ref 6–23)
CHLORIDE: 100 meq/L (ref 96–112)
CO2: 33 mEq/L — ABNORMAL HIGH (ref 19–32)
Calcium: 9.5 mg/dL (ref 8.4–10.5)
Creatinine, Ser: 1.11 mg/dL (ref 0.40–1.20)
GFR: 53.6 mL/min — AB (ref >60.00)
Glucose, Bld: 94 mg/dL (ref 70–99)
POTASSIUM: 4.3 meq/L (ref 3.5–5.1)
SODIUM: 143 meq/L (ref 135–145)

## 2016-12-09 LAB — LIPID PANEL
CHOL/HDL RATIO: 7
CHOLESTEROL: 283 mg/dL — AB (ref 0–200)
HDL: 38.3 mg/dL — AB (ref >39.00)
NONHDL: 244.57
TRIGLYCERIDES: 301 mg/dL — AB (ref 0.0–149.0)
VLDL: 60.2 mg/dL — ABNORMAL HIGH (ref 0.0–40.0)

## 2016-12-09 LAB — HEPATIC FUNCTION PANEL
ALT: 19 U/L (ref 0–35)
AST: 27 U/L (ref 0–37)
Albumin: 4.6 g/dL (ref 3.5–5.2)
Alkaline Phosphatase: 69 U/L (ref 39–117)
BILIRUBIN DIRECT: 0.1 mg/dL (ref 0.0–0.3)
BILIRUBIN TOTAL: 0.6 mg/dL (ref 0.2–1.2)
Total Protein: 7.3 g/dL (ref 6.0–8.3)

## 2016-12-09 LAB — LDL CHOLESTEROL, DIRECT: LDL DIRECT: 159 mg/dL

## 2016-12-09 LAB — TSH: TSH: 4.13 u[IU]/mL (ref 0.35–4.50)

## 2016-12-09 MED ORDER — CITALOPRAM HYDROBROMIDE 10 MG PO TABS: ORAL_TABLET | ORAL | 2 refills | Status: DC

## 2016-12-09 NOTE — Assessment & Plan Note (Signed)
Physical today 12/09/16.  PAP 06/19/13 - negative with negative HPV.  Repeat pap today.  Mammogram 12/16/15 - Birads 0.  F/u left breast mammogram 01/04/16 - Birads I.  Scheduled for f/u mammogram.  Colonoscopy 04/2012.
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
## 2016-12-10 LAB — URINE CULTURE
MICRO NUMBER:: 81140036
SPECIMEN QUALITY: ADEQUATE

## 2016-12-11 ENCOUNTER — Encounter: Payer: Self-pay | Admitting: Internal Medicine

## 2016-12-11 NOTE — Assessment & Plan Note (Signed)
Persistent neck pain and upper arm pain as outlined.  Has tried chiropractor and massage therapy.  Check c-spine xray.

## 2016-12-11 NOTE — Assessment & Plan Note (Signed)
Discussed with her today.  She is eating.  Treat stress as outlined.

## 2016-12-11 NOTE — Assessment & Plan Note (Signed)
No upper symptoms reported.  On protonix.  Follow.  

## 2016-12-11 NOTE — Assessment & Plan Note (Signed)
Follow lipid panel and liver function tests.

## 2016-12-11 NOTE — Assessment & Plan Note (Signed)
Colonoscopy 05/21/12 - tubular adenoma.  Due f/u in a few months.  Refer to GI.

## 2016-12-11 NOTE — Assessment & Plan Note (Signed)
Discussed with her today.  Check labs.  Encourage her to eat.  Treat stress as outlined.

## 2016-12-11 NOTE — Assessment & Plan Note (Signed)
On citalopram.  Husband just diagnosed with cancer.  Discussed with her today.  She plans to call her counselor.  Increase citalopram to 15mg  q day.

## 2016-12-12 LAB — CYTOLOGY - PAP
DIAGNOSIS: NEGATIVE
HPV: NOT DETECTED

## 2016-12-13 ENCOUNTER — Encounter: Payer: Self-pay | Admitting: Internal Medicine

## 2016-12-16 ENCOUNTER — Encounter: Payer: Self-pay | Admitting: Internal Medicine

## 2016-12-16 DIAGNOSIS — M79602 Pain in left arm: Secondary | ICD-10-CM

## 2016-12-16 DIAGNOSIS — M542 Cervicalgia: Secondary | ICD-10-CM

## 2016-12-19 NOTE — Telephone Encounter (Signed)
Dr. Okey Regal you want to order an MRI? There isn't one in her chart.

## 2016-12-20 NOTE — Telephone Encounter (Signed)
Order placed for c-spine MRI.

## 2016-12-23 MED ORDER — IBUPROFEN 800 MG PO TABS: 800.0000 mg | ORAL_TABLET | Freq: Every day | ORAL | 0 refills | Status: DC | PRN

## 2016-12-23 NOTE — Telephone Encounter (Signed)
Ms Tondreau sent me a my chart message regarding getting her MRI scheduled.  Thanks

## 2016-12-23 NOTE — Telephone Encounter (Signed)
rx sent in for ibuprofen.

## 2016-12-24 ENCOUNTER — Emergency Department
Admission: EM | Admit: 2016-12-24 | Discharge: 2016-12-24 | Disposition: A | Discharge status: Home / Self Care | Payer: BC Managed Care – PPO | Attending: Emergency Medicine | Admitting: Emergency Medicine

## 2016-12-24 ENCOUNTER — Encounter: Payer: Self-pay | Admitting: Emergency Medicine

## 2016-12-24 DIAGNOSIS — Z79899 Other long term (current) drug therapy: Secondary | ICD-10-CM | POA: Insufficient documentation

## 2016-12-24 DIAGNOSIS — M5030 Other cervical disc degeneration, unspecified cervical region: Secondary | ICD-10-CM | POA: Insufficient documentation

## 2016-12-24 DIAGNOSIS — M542 Cervicalgia: Secondary | ICD-10-CM | POA: Diagnosis present

## 2016-12-24 MED ORDER — DIAZEPAM 2 MG PO TABS: 2.0000 mg | ORAL_TABLET | Freq: Three times a day (TID) | ORAL | 0 refills | Status: DC | PRN

## 2016-12-24 MED ORDER — KETOROLAC TROMETHAMINE 30 MG/ML IJ SOLN
30.0000 mg | Freq: Once | INTRAMUSCULAR | Status: AC
  Administered 2016-12-24: 30 mg via INTRAMUSCULAR
  Filled 2016-12-24: qty 1

## 2016-12-24 MED ORDER — TRAMADOL HCL 50 MG PO TABS: 25.0000 mg | ORAL_TABLET | Freq: Four times a day (QID) | ORAL | 0 refills | Status: DC | PRN

## 2016-12-24 MED ORDER — ETODOLAC 400 MG PO TABS: 400.0000 mg | ORAL_TABLET | Freq: Two times a day (BID) | ORAL | 0 refills | Status: DC

## 2016-12-24 NOTE — ED Triage Notes (Signed)
States has had L neck pain since May. Has seen MD and diagnosed with degenerative changes. States pain increased yesterday with no fal or injury. Noted pain with movement.
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
## 2016-12-24 NOTE — Discharge Instructions (Signed)
Discontinue taking ibuprofen. Begin taking etodolac 400 mg twice a day with food. Diazepam 2 mg 1 every 8 hours if needed for muscle spasms and apply ice or heat to her neck as needed for discomfort. Also if any stronger medication is needed tramadol one half tablet every 6 hours as needed for pain.If you do not have any stomach discomfort he may increase to 1 tablet every 6 hours. This medication could cause drowsiness and increase her risk for falling. Also do not drive or operate machinery while taking this. Monday, [phone] which should be the scheduling department.  Ask them what time your MRI is on November 7th.

## 2016-12-26 ENCOUNTER — Encounter: Payer: Self-pay | Admitting: Internal Medicine

## 2016-12-28 NOTE — Telephone Encounter (Signed)
Pt is requesting an endoscopy to be added for her colonoscopy at [facility]. Can you please add a referral for this please?

## 2017-01-04 ENCOUNTER — Inpatient Hospital Stay: Admission: RE | Admit: 2017-01-04 | Payer: BC Managed Care – PPO | Source: Ambulatory Visit

## 2017-01-05 ENCOUNTER — Ambulatory Visit
Admission: RE | Admit: 2017-01-05 | Discharge: 2017-01-05 | Disposition: A | Discharge status: Home / Self Care | Payer: BC Managed Care – PPO | Source: Ambulatory Visit | Attending: Internal Medicine | Admitting: Internal Medicine

## 2017-01-05 DIAGNOSIS — M542 Cervicalgia: Secondary | ICD-10-CM | POA: Diagnosis present

## 2017-01-05 DIAGNOSIS — M4692 Unspecified inflammatory spondylopathy, cervical region: Secondary | ICD-10-CM | POA: Diagnosis not present

## 2017-01-05 DIAGNOSIS — M4802 Spinal stenosis, cervical region: Secondary | ICD-10-CM | POA: Diagnosis not present

## 2017-01-05 DIAGNOSIS — M79602 Pain in left arm: Secondary | ICD-10-CM

## 2017-01-06 ENCOUNTER — Encounter: Payer: Self-pay | Admitting: Internal Medicine

## 2017-01-06 DIAGNOSIS — M542 Cervicalgia: Secondary | ICD-10-CM

## 2017-01-09 NOTE — Telephone Encounter (Signed)
Order placed for neurosurgery referral 

## 2017-01-10 MED ORDER — TIZANIDINE HCL 4 MG PO TABS: 4.0000 mg | ORAL_TABLET | Freq: Every evening | ORAL | 0 refills | Status: DC | PRN

## 2017-01-10 NOTE — Telephone Encounter (Signed)
rx sent in for tizanidine.   

## 2017-01-15 ENCOUNTER — Other Ambulatory Visit: Payer: Self-pay | Admitting: Internal Medicine

## 2017-01-25 ENCOUNTER — Other Ambulatory Visit: Payer: Self-pay | Admitting: Internal Medicine

## 2017-01-25 ENCOUNTER — Ambulatory Visit
Admission: RE | Admit: 2017-01-25 | Discharge: 2017-01-25 | Disposition: A | Discharge status: Home / Self Care | Payer: BC Managed Care – PPO | Source: Ambulatory Visit | Attending: Internal Medicine | Admitting: Internal Medicine

## 2017-01-25 DIAGNOSIS — Z1231 Encounter for screening mammogram for malignant neoplasm of breast: Secondary | ICD-10-CM | POA: Insufficient documentation

## 2017-01-25 DIAGNOSIS — Z1239 Encounter for other screening for malignant neoplasm of breast: Secondary | ICD-10-CM

## 2017-02-09 ENCOUNTER — Other Ambulatory Visit: Payer: Self-pay | Admitting: Internal Medicine

## 2017-03-02 ENCOUNTER — Other Ambulatory Visit: Payer: Self-pay | Admitting: Internal Medicine

## 2017-03-03 MED ORDER — EZETIMIBE 10 MG PO TABS: 10.0000 mg | ORAL_TABLET | Freq: Every day | ORAL | 0 refills | Status: DC

## 2017-03-05 ENCOUNTER — Other Ambulatory Visit: Payer: Self-pay | Admitting: Emergency Medicine

## 2017-03-06 MED ORDER — ETODOLAC 400 MG PO TABS: 400.0000 mg | ORAL_TABLET | Freq: Two times a day (BID) | ORAL | 0 refills | Status: DC

## 2017-03-06 MED ORDER — DIAZEPAM 2 MG PO TABS: 2.0000 mg | ORAL_TABLET | Freq: Three times a day (TID) | ORAL | 0 refills | Status: DC | PRN

## 2017-03-08 ENCOUNTER — Other Ambulatory Visit: Payer: Self-pay | Admitting: Emergency Medicine

## 2017-04-02 ENCOUNTER — Encounter: Payer: Self-pay | Admitting: Internal Medicine

## 2017-04-04 ENCOUNTER — Telehealth: Payer: Self-pay | Admitting: Internal Medicine

## 2017-04-04 NOTE — Telephone Encounter (Signed)
Sent pt a my chart message regarding scheduling an appt with GI to discuss EGD.

## 2017-05-14 ENCOUNTER — Other Ambulatory Visit: Payer: Self-pay | Admitting: Internal Medicine

## 2017-05-29 ENCOUNTER — Encounter: Payer: Self-pay | Admitting: Internal Medicine

## 2017-05-29 LAB — HM COLONOSCOPY

## 2017-06-23 ENCOUNTER — Other Ambulatory Visit: Payer: Self-pay | Admitting: Internal Medicine

## 2017-07-03 ENCOUNTER — Other Ambulatory Visit: Payer: Self-pay | Admitting: Internal Medicine

## 2017-08-10 ENCOUNTER — Other Ambulatory Visit: Payer: Self-pay | Admitting: Internal Medicine

## 2017-08-11 MED ORDER — PANTOPRAZOLE SODIUM 40 MG PO TBEC: 40.0000 mg | DELAYED_RELEASE_TABLET | Freq: Every day | ORAL | 6 refills | Status: DC

## 2017-11-24 ENCOUNTER — Ambulatory Visit: Payer: BC Managed Care – PPO | Admitting: Internal Medicine

## 2017-11-28 ENCOUNTER — Other Ambulatory Visit: Payer: Self-pay | Admitting: Internal Medicine

## 2017-12-01 NOTE — Telephone Encounter (Signed)
Pt last seen on 12/09/16. Okay to refill?

## 2017-12-03 ENCOUNTER — Other Ambulatory Visit: Payer: Self-pay | Admitting: Internal Medicine

## 2017-12-04 NOTE — Telephone Encounter (Signed)
I ok'd citalopram x 1 (for 30 days) and protonix - she needs to make a f/u appt before next refill.

## 2017-12-06 NOTE — Telephone Encounter (Signed)
Refilled on 12/04/17

## 2017-12-07 ENCOUNTER — Encounter: Payer: Self-pay | Admitting: Internal Medicine

## 2017-12-07 ENCOUNTER — Encounter: Payer: Self-pay | Admitting: *Deleted

## 2017-12-07 NOTE — Telephone Encounter (Signed)
Sent mychart message

## 2017-12-11 NOTE — Telephone Encounter (Signed)
No OV since 10/18 please advise to citalopram refill.

## 2017-12-12 MED ORDER — CITALOPRAM HYDROBROMIDE 10 MG PO TABS: 15.0000 mg | ORAL_TABLET | Freq: Every day | ORAL | 0 refills | Status: DC

## 2017-12-12 NOTE — Telephone Encounter (Signed)
LMTCB to schedule earlier appt

## 2017-12-12 NOTE — Telephone Encounter (Signed)
I have sent in a 30 day supply of her citalopram.  Needs earlier appt.

## 2017-12-15 ENCOUNTER — Encounter: Payer: Self-pay | Admitting: Internal Medicine

## 2017-12-19 NOTE — Telephone Encounter (Signed)
Pt scheduled  

## 2018-01-29 ENCOUNTER — Other Ambulatory Visit (HOSPITAL_COMMUNITY)
Admission: RE | Admit: 2018-01-29 | Discharge: 2018-01-29 | Disposition: A | Discharge status: Home / Self Care | Payer: BC Managed Care – PPO | Source: Ambulatory Visit | Attending: Internal Medicine | Admitting: Internal Medicine

## 2018-01-29 ENCOUNTER — Encounter: Payer: Self-pay | Admitting: Internal Medicine

## 2018-01-29 ENCOUNTER — Ambulatory Visit (INDEPENDENT_AMBULATORY_CARE_PROVIDER_SITE_OTHER): Payer: BC Managed Care – PPO | Admitting: Internal Medicine

## 2018-01-29 VITALS — BP 128/82 | HR 88 | Temp 97.6°F | Resp 16 | Wt 118.2 lb

## 2018-01-29 DIAGNOSIS — Z124 Encounter for screening for malignant neoplasm of cervix: Secondary | ICD-10-CM | POA: Insufficient documentation

## 2018-01-29 DIAGNOSIS — Z1231 Encounter for screening mammogram for malignant neoplasm of breast: Secondary | ICD-10-CM | POA: Diagnosis not present

## 2018-01-29 DIAGNOSIS — F502 Bulimia nervosa, unspecified: Secondary | ICD-10-CM

## 2018-01-29 DIAGNOSIS — D649 Anemia, unspecified: Secondary | ICD-10-CM

## 2018-01-29 DIAGNOSIS — Z0000 Encounter for general adult medical examination without abnormal findings: Secondary | ICD-10-CM

## 2018-01-29 DIAGNOSIS — K209 Esophagitis, unspecified without bleeding: Secondary | ICD-10-CM

## 2018-01-29 DIAGNOSIS — E7800 Pure hypercholesterolemia, unspecified: Secondary | ICD-10-CM

## 2018-01-29 DIAGNOSIS — Z23 Encounter for immunization: Secondary | ICD-10-CM | POA: Diagnosis not present

## 2018-01-29 DIAGNOSIS — N3010 Interstitial cystitis (chronic) without hematuria: Secondary | ICD-10-CM

## 2018-01-29 DIAGNOSIS — R3915 Urgency of urination: Secondary | ICD-10-CM | POA: Diagnosis not present

## 2018-01-29 NOTE — Patient Instructions (Signed)
Dr Cephus Shelling - [phone] - 0430.
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
## 2018-01-29 NOTE — Assessment & Plan Note (Addendum)
Physical today 01/29/18.   Schedule mammogram.  Last 01/26/17 - Birads I.  Just had colonoscopy last year.  Obtain results.  PAP 01/29/18.

## 2018-01-30 LAB — URINE CULTURE
MICRO NUMBER:: 91439691
SPECIMEN QUALITY:: ADEQUATE

## 2018-01-30 LAB — URINALYSIS, ROUTINE W REFLEX MICROSCOPIC
BILIRUBIN URINE: NEGATIVE
KETONES UR: NEGATIVE
LEUKOCYTES UA: NEGATIVE
NITRITE: NEGATIVE
Specific Gravity, Urine: 1.01 (ref 1.000–1.030)
TOTAL PROTEIN, URINE-UPE24: NEGATIVE
URINE GLUCOSE: NEGATIVE
UROBILINOGEN UA: 0.2 (ref 0.0–1.0)
pH: 7 (ref 5.0–8.0)

## 2018-01-30 NOTE — Assessment & Plan Note (Signed)
Had EGD last year.  Obtain results.  On protonix.  Feels symptoms controlled.

## 2018-01-30 NOTE — Assessment & Plan Note (Signed)
Follow cbc.  

## 2018-01-30 NOTE — Assessment & Plan Note (Signed)
Discussed with her today.  She is maintaining her weight.  Feels is stable, but still an issue.  Discussed referral to psychiatrist.  Discussed seeing a counselor. Name and number given.

## 2018-01-30 NOTE — Assessment & Plan Note (Signed)
Was followed by Dr Jacqlyn Larsen.  Some urgency - not acute.  Request urine check.

## 2018-01-30 NOTE — Assessment & Plan Note (Signed)
Follow lipid panel.   
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
## 2018-01-31 ENCOUNTER — Other Ambulatory Visit: Payer: Self-pay | Admitting: Internal Medicine

## 2018-01-31 ENCOUNTER — Encounter: Payer: Self-pay | Admitting: Internal Medicine

## 2018-01-31 DIAGNOSIS — R319 Hematuria, unspecified: Secondary | ICD-10-CM

## 2018-01-31 LAB — CYTOLOGY - PAP
Diagnosis: NEGATIVE
HPV: NOT DETECTED

## 2018-01-31 NOTE — Progress Notes (Signed)
Order placed for f/u urinalysis 

## 2018-02-14 ENCOUNTER — Ambulatory Visit
Admission: RE | Admit: 2018-02-14 | Discharge: 2018-02-14 | Disposition: A | Discharge status: Home / Self Care | Payer: BC Managed Care – PPO | Source: Ambulatory Visit | Attending: Internal Medicine | Admitting: Internal Medicine

## 2018-02-14 DIAGNOSIS — Z1231 Encounter for screening mammogram for malignant neoplasm of breast: Secondary | ICD-10-CM | POA: Insufficient documentation

## 2018-02-19 ENCOUNTER — Ambulatory Visit (INDEPENDENT_AMBULATORY_CARE_PROVIDER_SITE_OTHER): Payer: BC Managed Care – PPO | Admitting: Family Medicine

## 2018-02-19 ENCOUNTER — Encounter: Payer: Self-pay | Admitting: Family Medicine

## 2018-02-19 ENCOUNTER — Other Ambulatory Visit (INDEPENDENT_AMBULATORY_CARE_PROVIDER_SITE_OTHER): Payer: BC Managed Care – PPO

## 2018-02-19 VITALS — BP 120/80 | HR 93 | Temp 97.8°F | Ht 63.0 in | Wt 116.4 lb

## 2018-02-19 DIAGNOSIS — E7800 Pure hypercholesterolemia, unspecified: Secondary | ICD-10-CM | POA: Diagnosis not present

## 2018-02-19 DIAGNOSIS — F502 Bulimia nervosa: Secondary | ICD-10-CM

## 2018-02-19 DIAGNOSIS — D649 Anemia, unspecified: Secondary | ICD-10-CM

## 2018-02-19 DIAGNOSIS — R319 Hematuria, unspecified: Secondary | ICD-10-CM

## 2018-02-19 DIAGNOSIS — R0981 Nasal congestion: Secondary | ICD-10-CM | POA: Diagnosis not present

## 2018-02-19 DIAGNOSIS — H65193 Other acute nonsuppurative otitis media, bilateral: Secondary | ICD-10-CM | POA: Diagnosis not present

## 2018-02-19 DIAGNOSIS — H9201 Otalgia, right ear: Secondary | ICD-10-CM | POA: Diagnosis not present

## 2018-02-19 LAB — BASIC METABOLIC PANEL
BUN: 12 mg/dL (ref 6–23)
CHLORIDE: 101 meq/L (ref 96–112)
CO2: 31 mEq/L (ref 19–32)
Calcium: 9.6 mg/dL (ref 8.4–10.5)
Creatinine, Ser: 1.1 mg/dL (ref 0.40–1.20)
GFR: 53.94 mL/min — AB (ref >60.00)
Glucose, Bld: 92 mg/dL (ref 70–99)
POTASSIUM: 3.6 meq/L (ref 3.5–5.1)
SODIUM: 140 meq/L (ref 135–145)

## 2018-02-19 LAB — URINALYSIS, ROUTINE W REFLEX MICROSCOPIC
Bilirubin Urine: NEGATIVE
HGB URINE DIPSTICK: NEGATIVE
Ketones, ur: NEGATIVE
Leukocytes, UA: NEGATIVE
Nitrite: NEGATIVE
PH: 8 (ref 5.0–8.0)
RBC / HPF: NONE SEEN (ref 0–?)
Specific Gravity, Urine: 1.01 (ref 1.000–1.030)
Total Protein, Urine: NEGATIVE
Urine Glucose: NEGATIVE
Urobilinogen, UA: 1 (ref 0.0–1.0)

## 2018-02-19 LAB — TSH: TSH: 4.85 u[IU]/mL — ABNORMAL HIGH (ref 0.35–4.50)

## 2018-02-19 LAB — CBC WITH DIFFERENTIAL/PLATELET
Basophils Absolute: 0 10*3/uL (ref 0.0–0.1)
Basophils Relative: 0.2 % (ref 0.0–3.0)
EOS ABS: 0 10*3/uL (ref 0.0–0.7)
Eosinophils Relative: 0 % (ref 0.0–5.0)
HCT: 38.9 % (ref 36.0–46.0)
Hemoglobin: 13.7 g/dL (ref 12.0–15.0)
Lymphocytes Relative: 25.5 % (ref 12.0–46.0)
Lymphs Abs: 1.8 10*3/uL (ref 0.7–4.0)
MCHC: 35.1 g/dL (ref 30.0–36.0)
MCV: 89.4 fl (ref 78.0–100.0)
MONO ABS: 0.5 10*3/uL (ref 0.1–1.0)
Monocytes Relative: 7.5 % (ref 3.0–12.0)
NEUTROS ABS: 4.7 10*3/uL (ref 1.4–7.7)
NEUTROS PCT: 66.8 % (ref 43.0–77.0)
PLATELETS: 210 10*3/uL (ref 150.0–400.0)
RBC: 4.36 Mil/uL (ref 3.87–5.11)
RDW: 12.5 % (ref 11.5–15.5)
WBC: 7.1 10*3/uL (ref 4.0–10.5)

## 2018-02-19 LAB — HEPATIC FUNCTION PANEL
ALBUMIN: 4.3 g/dL (ref 3.5–5.2)
ALK PHOS: 64 U/L (ref 39–117)
ALT: 13 U/L (ref 0–35)
AST: 16 U/L (ref 0–37)
BILIRUBIN DIRECT: 0.1 mg/dL (ref 0.0–0.3)
Total Bilirubin: 0.7 mg/dL (ref 0.2–1.2)
Total Protein: 7 g/dL (ref 6.0–8.3)

## 2018-02-19 LAB — LIPID PANEL
CHOLESTEROL: 249 mg/dL — AB (ref 0–200)
HDL: 35.7 mg/dL — AB (ref >39.00)
NONHDL: 213.34
TRIGLYCERIDES: 216 mg/dL — AB (ref 0.0–149.0)
Total CHOL/HDL Ratio: 7
VLDL: 43.2 mg/dL — ABNORMAL HIGH (ref 0.0–40.0)

## 2018-02-19 LAB — HEMOGLOBIN A1C: Hgb A1c MFr Bld: 5.4 % (ref 4.6–6.5)

## 2018-02-19 LAB — LDL CHOLESTEROL, DIRECT: Direct LDL: 164 mg/dL

## 2018-02-19 LAB — VITAMIN B12: Vitamin B-12: >1525 pg/mL — ABNORMAL HIGH (ref 211–911)

## 2018-02-19 LAB — VITAMIN D 25 HYDROXY (VIT D DEFICIENCY, FRACTURES): VITD: 20.85 ng/mL — AB (ref 30.00–100.00)

## 2018-02-19 MED ORDER — LORATADINE 10 MG PO TABS: 10.0000 mg | ORAL_TABLET | Freq: Every day | ORAL | 3 refills | Status: DC

## 2018-02-19 MED ORDER — FLUTICASONE PROPIONATE 50 MCG/ACT NA SUSP: 1.0000 | Freq: Every day | NASAL | 2 refills | Status: DC

## 2018-02-19 NOTE — Progress Notes (Signed)
Subjective:    Patient ID: Linda Perez, female    DOB: 04-07-1960, 57 y.o.   MRN: [id number]  HPI  Presents to clinic c/o right ear pain and sinus congestion for 4 days. Currently she is not on any sort of allergy medication or nasal sprays.   No fever or chills. No nausea, vomiting, diarrhea. No cough, wheeze or SOB.  Patient Active Problem List   Diagnosis Date Noted  . Neck pain 12/09/2016  . SOB (shortness of breath) on exertion 06/29/2016  . Genetic testing 12/13/2015  . Arm skin lesion, left 11/15/2015  . Blurred vision 08/13/2015  . Elevated blood pressure 08/13/2015  . Breast tenderness in female 08/11/2015  . Interstitial cystitis 03/03/2015  . Health care maintenance 06/29/2014  . Weight loss 06/29/2014  . Bulimia 06/25/2012  . Esophagitis 06/15/2012  . History of colonic polyps 06/06/2012  . Noninfectious gastroenteritis and colitis 04/08/2012  . Anemia 04/08/2012  . Hypercholesterolemia 04/08/2012  . Situational anxiety 04/08/2012   Social History   Tobacco Use  . Smoking status: Never Smoker  . Smokeless tobacco: Never Used  Substance Use Topics  . Alcohol use: No    Alcohol/week: 0.0 standard drinks    Review of Systems  Constitutional: Negative for chills, fatigue and fever.  HENT: +right ear pain, +nasal congestion   Eyes: Negative.   Respiratory: Negative for cough, shortness of breath and wheezing.   Cardiovascular: Negative for chest pain, palpitations and leg swelling.  Gastrointestinal: Negative for abdominal pain, diarrhea, nausea and vomiting.  Genitourinary: Negative for dysuria, frequency and urgency.  Musculoskeletal: Negative for arthralgias and myalgias.  Skin: Negative for color change, pallor and rash.  Neurological: Negative for syncope, light-headedness and headaches.  Psychiatric/Behavioral: The patient is not nervous/anxious.       Objective:   Physical Exam  Constitutional: She appears well-developed and  well-nourished. No distress.  HENT:  Head: Normocephalic and atraumatic.  Eyes: EOM are normal. No scleral icterus. Ears: Middle ear effusion bilaterally, TMs are not bright red -they are slightly pink and round.  Ear canal normal, no tenderness of pinna or mastoid area. Nose/throat: Small amount of clear nasal drainage and postnasal drip seen.  No maxillary and facial sinus tenderness with palpation. Neck: Normal range of motion. Neck supple. No tracheal deviation present.  Cardiovascular: Normal rate, regular rhythm and normal heart sounds.  Pulmonary/Chest: Effort normal and breath sounds normal. No respiratory distress. She has no wheezes. She has no rales.  Abdominal: Soft. Bowel sounds are normal. There is no tenderness.  Neurological: She is alert and oriented to person, place, and time.  Gait normal  Skin: Skin is warm and dry. No pallor.  Psychiatric: She has a normal mood and affect. Her behavior is normal. Thought content normal.   Nursing note and vitals reviewed.    Vitals:   02/19/18 1322  BP: 120/80  Pulse: 93  Temp: 97.8 F (36.6 C)  SpO2: 98%   Assessment & Plan:   Right ear pain, sinus congestion, bilateral middle ear effusion - I suspect the pain is related to middle ear effusion, patient will begin taking Claritin 10 mg once per day.  Patient also will use Flonase nasal spray as this will help with sinus congestion and hopefully help get the ears draining as well.  Patient advised that if we can get congestion to improve and get fluid draining, often times antibiotics are required to treat the ears. Patient advised to increase fluid intake, do good  handwashing and get plenty of rest.  Patient will keep regularly scheduled follow-up with PCP as planned.  Advised return to clinic sooner if any issues arise.

## 2018-02-20 ENCOUNTER — Other Ambulatory Visit: Payer: Self-pay | Admitting: Internal Medicine

## 2018-02-20 MED ORDER — CITALOPRAM HYDROBROMIDE 10 MG PO TABS: 15.0000 mg | ORAL_TABLET | Freq: Every day | ORAL | 0 refills | Status: DC

## 2018-02-22 ENCOUNTER — Encounter: Payer: Self-pay | Admitting: Internal Medicine

## 2018-02-23 ENCOUNTER — Other Ambulatory Visit: Payer: Self-pay | Admitting: Internal Medicine

## 2018-02-23 DIAGNOSIS — R7989 Other specified abnormal findings of blood chemistry: Secondary | ICD-10-CM

## 2018-02-23 NOTE — Progress Notes (Signed)
Order placed for f/u tsh.  

## 2018-02-28 ENCOUNTER — Encounter: Payer: Self-pay | Admitting: Internal Medicine

## 2018-03-15 ENCOUNTER — Encounter: Payer: Self-pay | Admitting: Internal Medicine

## 2018-03-16 ENCOUNTER — Encounter: Payer: Self-pay | Admitting: Internal Medicine

## 2018-03-20 ENCOUNTER — Encounter: Payer: Self-pay | Admitting: Internal Medicine

## 2018-03-20 ENCOUNTER — Ambulatory Visit (INDEPENDENT_AMBULATORY_CARE_PROVIDER_SITE_OTHER): Payer: BC Managed Care – PPO | Admitting: Internal Medicine

## 2018-03-20 VITALS — BP 130/70 | HR 94 | Temp 98.5°F | Resp 16 | Wt 121.4 lb

## 2018-03-20 DIAGNOSIS — R109 Unspecified abdominal pain: Secondary | ICD-10-CM

## 2018-03-20 DIAGNOSIS — K209 Esophagitis, unspecified without bleeding: Secondary | ICD-10-CM

## 2018-03-20 DIAGNOSIS — K625 Hemorrhage of anus and rectum: Secondary | ICD-10-CM | POA: Insufficient documentation

## 2018-03-20 DIAGNOSIS — E7800 Pure hypercholesterolemia, unspecified: Secondary | ICD-10-CM

## 2018-03-20 DIAGNOSIS — F502 Bulimia nervosa: Secondary | ICD-10-CM

## 2018-03-20 DIAGNOSIS — R7989 Other specified abnormal findings of blood chemistry: Secondary | ICD-10-CM

## 2018-03-20 DIAGNOSIS — D649 Anemia, unspecified: Secondary | ICD-10-CM

## 2018-03-20 MED ORDER — CITALOPRAM HYDROBROMIDE 10 MG PO TABS: 15.0000 mg | ORAL_TABLET | Freq: Every day | ORAL | 0 refills | Status: DC
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
## 2018-03-21 LAB — BASIC METABOLIC PANEL
BUN: 12 mg/dL (ref 6–23)
CO2: 30 mEq/L (ref 19–32)
Calcium: 9.6 mg/dL (ref 8.4–10.5)
Chloride: 102 mEq/L (ref 96–112)
Creatinine, Ser: 0.97 mg/dL (ref 0.40–1.20)
GFR: 58.66 mL/min — ABNORMAL LOW (ref >60.00)
GLUCOSE: 84 mg/dL (ref 70–99)
Potassium: 3.8 mEq/L (ref 3.5–5.1)
Sodium: 140 mEq/L (ref 135–145)

## 2018-03-21 LAB — CBC WITH DIFFERENTIAL/PLATELET
Basophils Absolute: 0.1 10*3/uL (ref 0.0–0.1)
Basophils Relative: 1.2 % (ref 0.0–3.0)
Eosinophils Absolute: 0 10*3/uL (ref 0.0–0.7)
Eosinophils Relative: 0 % (ref 0.0–5.0)
HEMATOCRIT: 36.9 % (ref 36.0–46.0)
Hemoglobin: 12.9 g/dL (ref 12.0–15.0)
Lymphocytes Relative: 28.4 % (ref 12.0–46.0)
Lymphs Abs: 2.3 10*3/uL (ref 0.7–4.0)
MCHC: 35.1 g/dL (ref 30.0–36.0)
MCV: 90 fl (ref 78.0–100.0)
MONOS PCT: 5.8 % (ref 3.0–12.0)
Monocytes Absolute: 0.5 10*3/uL (ref 0.1–1.0)
Neutro Abs: 5.2 10*3/uL (ref 1.4–7.7)
Neutrophils Relative %: 64.6 % (ref 43.0–77.0)
Platelets: 228 10*3/uL (ref 150.0–400.0)
RBC: 4.1 Mil/uL (ref 3.87–5.11)
RDW: 12.8 % (ref 11.5–15.5)
WBC: 8.1 10*3/uL (ref 4.0–10.5)

## 2018-03-21 LAB — TSH: TSH: 6.36 u[IU]/mL — ABNORMAL HIGH (ref 0.35–4.50)

## 2018-03-21 LAB — CA 125: CA 125: 10 U/mL (ref <35)

## 2018-03-22 ENCOUNTER — Encounter: Payer: Self-pay | Admitting: Internal Medicine

## 2018-03-22 ENCOUNTER — Other Ambulatory Visit: Payer: Self-pay | Admitting: Internal Medicine

## 2018-03-22 DIAGNOSIS — E039 Hypothyroidism, unspecified: Secondary | ICD-10-CM

## 2018-03-22 NOTE — Telephone Encounter (Signed)
Ok to give her lab results per result note.  The physicians name is Dr Nicolasa Ducking.  Can give her office number.  I can also place order for referral if she desires.  Ok to refill citalopram #30 with 2 refills.  Confirm with her she is talking about CT of abdomen and pelvis to be scheduled on 04/13/18.  See me if questions before calling.

## 2018-03-22 NOTE — Progress Notes (Signed)
Order placed for f/u tsh.  

## 2018-03-22 NOTE — Telephone Encounter (Signed)
I have a result note on this patient. She saw her labs on my chart before I called her. These are some questions she has. Also, I am assuming the doctors she is talking about would be pyschiatry?

## 2018-03-22 NOTE — Telephone Encounter (Signed)
LMTCB

## 2018-03-23 ENCOUNTER — Telehealth: Payer: Self-pay | Admitting: Internal Medicine

## 2018-03-23 NOTE — Telephone Encounter (Signed)
See result note.  

## 2018-03-23 NOTE — Telephone Encounter (Signed)
Copied from [place] [id number]. Topic: Quick Communication - Lab Results (Clinic Use ONLY) >> Mar 22, 2018  4:07 PM Lars Masson, LPN wrote: Called patient to inform them of  lab results. When patient returns call, triage nurse may disclose results.  pt returning call for lab results

## 2018-03-25 ENCOUNTER — Other Ambulatory Visit: Payer: Self-pay | Admitting: Internal Medicine

## 2018-03-25 ENCOUNTER — Encounter: Payer: Self-pay | Admitting: Internal Medicine

## 2018-03-25 DIAGNOSIS — R1084 Generalized abdominal pain: Secondary | ICD-10-CM

## 2018-03-25 MED ORDER — CITALOPRAM HYDROBROMIDE 10 MG PO TABS: 15.0000 mg | ORAL_TABLET | Freq: Every day | ORAL | 2 refills | Status: DC

## 2018-03-25 MED ORDER — LEVOTHYROXINE SODIUM 50 MCG PO TABS: 50.0000 ug | ORAL_TABLET | Freq: Every day | ORAL | 2 refills | Status: DC

## 2018-03-25 NOTE — Progress Notes (Signed)
rx sent in for synthroid and citalopram.  The tsh is ordered for the f/u lab.  This will be too soon for vitamin D to be checked.  We will check vitamin D in the future.  CT ordered.

## 2018-03-26 ENCOUNTER — Encounter: Payer: Self-pay | Admitting: Internal Medicine

## 2018-03-26 MED ORDER — EZETIMIBE 10 MG PO TABS: 10.0000 mg | ORAL_TABLET | Freq: Every day | ORAL | 5 refills | Status: DC

## 2018-03-26 NOTE — Assessment & Plan Note (Signed)
Had EGD previously.  Upper symptoms controlled.

## 2018-03-26 NOTE — Assessment & Plan Note (Signed)
Follow cbc.  

## 2018-03-26 NOTE — Assessment & Plan Note (Signed)
Follow lipid panel.   

## 2018-03-26 NOTE — Telephone Encounter (Signed)
LMTCB

## 2018-03-26 NOTE — Assessment & Plan Note (Signed)
Discussed with her today.  Maintaining her weight.  Discussed referral to psychiatry.  She is on citalopram.  Agreed to referral to psychiatry.  She wants to call for appt.

## 2018-03-26 NOTE — Telephone Encounter (Signed)
Patient is aware 

## 2018-03-26 NOTE — Assessment & Plan Note (Signed)
With previous abdominal pain and diarrhea with notice of blood.  Stool back to normal now.  Still with abdominal discomfort.  Check CT abdomen and pelvis.  Also, get her back in with GI.

## 2018-03-26 NOTE — Telephone Encounter (Signed)
Advised that synthroid and citalopram sent to pharmacy

## 2018-04-02 ENCOUNTER — Telehealth: Payer: Self-pay | Admitting: *Deleted

## 2018-04-02 DIAGNOSIS — Z1211 Encounter for screening for malignant neoplasm of colon: Secondary | ICD-10-CM

## 2018-04-02 NOTE — Telephone Encounter (Signed)
Pt dropped off Ifob. No order found. Placed order

## 2018-04-04 ENCOUNTER — Encounter: Payer: Self-pay | Admitting: Internal Medicine

## 2018-04-04 ENCOUNTER — Other Ambulatory Visit (INDEPENDENT_AMBULATORY_CARE_PROVIDER_SITE_OTHER): Payer: BC Managed Care – PPO

## 2018-04-04 DIAGNOSIS — Z1211 Encounter for screening for malignant neoplasm of colon: Secondary | ICD-10-CM

## 2018-04-04 LAB — FECAL OCCULT BLOOD, IMMUNOCHEMICAL: FECAL OCCULT BLD: NEGATIVE

## 2018-04-13 ENCOUNTER — Ambulatory Visit
Admission: RE | Admit: 2018-04-13 | Discharge: 2018-04-13 | Disposition: A | Discharge status: Home / Self Care | Payer: BC Managed Care – PPO | Source: Ambulatory Visit | Attending: Internal Medicine | Admitting: Internal Medicine

## 2018-04-13 DIAGNOSIS — R1084 Generalized abdominal pain: Secondary | ICD-10-CM | POA: Insufficient documentation

## 2018-04-13 MED ORDER — IOPAMIDOL (ISOVUE-300) INJECTION 61%
85.0000 mL | Freq: Once | INTRAVENOUS | Status: AC | PRN
  Administered 2018-04-13: 85 mL via INTRAVENOUS

## 2018-04-23 ENCOUNTER — Other Ambulatory Visit: Payer: Self-pay | Admitting: Internal Medicine

## 2018-04-30 ENCOUNTER — Other Ambulatory Visit (INDEPENDENT_AMBULATORY_CARE_PROVIDER_SITE_OTHER): Payer: BC Managed Care – PPO

## 2018-04-30 DIAGNOSIS — E039 Hypothyroidism, unspecified: Secondary | ICD-10-CM

## 2018-05-01 ENCOUNTER — Other Ambulatory Visit: Payer: Self-pay | Admitting: Internal Medicine

## 2018-05-01 DIAGNOSIS — E039 Hypothyroidism, unspecified: Secondary | ICD-10-CM

## 2018-05-01 LAB — TSH: TSH: 4.76 u[IU]/mL — ABNORMAL HIGH (ref 0.35–4.50)

## 2018-05-01 NOTE — Progress Notes (Signed)
Order placed for f/u tsh.  

## 2018-06-27 ENCOUNTER — Other Ambulatory Visit: Payer: Self-pay

## 2018-06-27 ENCOUNTER — Other Ambulatory Visit (INDEPENDENT_AMBULATORY_CARE_PROVIDER_SITE_OTHER): Payer: BC Managed Care – PPO

## 2018-06-27 DIAGNOSIS — E039 Hypothyroidism, unspecified: Secondary | ICD-10-CM

## 2018-06-28 ENCOUNTER — Encounter: Payer: Self-pay | Admitting: Internal Medicine

## 2018-06-28 LAB — TSH: TSH: 0.92 u[IU]/mL (ref 0.35–4.50)

## 2018-07-07 ENCOUNTER — Encounter: Payer: Self-pay | Admitting: Internal Medicine

## 2018-07-09 ENCOUNTER — Other Ambulatory Visit: Payer: Self-pay

## 2018-07-09 MED ORDER — LEVOTHYROXINE SODIUM 50 MCG PO TABS: 50.0000 ug | ORAL_TABLET | Freq: Every day | ORAL | 2 refills | Status: DC

## 2018-09-27 ENCOUNTER — Encounter: Payer: Self-pay | Admitting: Internal Medicine

## 2018-09-28 MED ORDER — LEVOTHYROXINE SODIUM 50 MCG PO TABS: 50.0000 ug | ORAL_TABLET | Freq: Every day | ORAL | 1 refills | Status: DC

## 2018-10-02 ENCOUNTER — Other Ambulatory Visit: Payer: Self-pay

## 2018-10-02 MED ORDER — CITALOPRAM HYDROBROMIDE 10 MG PO TABS: 15.0000 mg | ORAL_TABLET | Freq: Every day | ORAL | 2 refills | Status: DC

## 2019-01-13 ENCOUNTER — Encounter: Payer: Self-pay | Admitting: Internal Medicine

## 2019-01-14 NOTE — Telephone Encounter (Signed)
Is this okay with you?   

## 2019-01-14 NOTE — Telephone Encounter (Signed)
LMTCB

## 2019-01-14 NOTE — Telephone Encounter (Signed)
See last unrouted message.

## 2019-01-14 NOTE — Telephone Encounter (Signed)
Ok to schedule virtual appt and if out - refill citalopram until appt.

## 2019-01-17 ENCOUNTER — Other Ambulatory Visit: Payer: Self-pay

## 2019-01-17 ENCOUNTER — Ambulatory Visit (INDEPENDENT_AMBULATORY_CARE_PROVIDER_SITE_OTHER): Payer: BC Managed Care – PPO | Admitting: Internal Medicine

## 2019-01-17 DIAGNOSIS — D649 Anemia, unspecified: Secondary | ICD-10-CM

## 2019-01-17 DIAGNOSIS — F418 Other specified anxiety disorders: Secondary | ICD-10-CM

## 2019-01-17 DIAGNOSIS — F502 Bulimia nervosa: Secondary | ICD-10-CM

## 2019-01-17 DIAGNOSIS — E7800 Pure hypercholesterolemia, unspecified: Secondary | ICD-10-CM
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
## 2019-01-20 ENCOUNTER — Encounter: Payer: Self-pay | Admitting: Internal Medicine

## 2019-01-20 NOTE — Assessment & Plan Note (Signed)
On citalopram.  Stable.  Continue.

## 2019-01-20 NOTE — Assessment & Plan Note (Signed)
Not seeing Dr Nicolasa Ducking.  On citalopram.  Feels things are stable.  Would like to continue current medication regimen for now.  Follow.  Seeing GI.  Planning for colonoscopy.  Will let me know when calls to reschedule - with question of need for EGD given history.

## 2019-01-20 NOTE — Assessment & Plan Note (Signed)
Follow cbc.  

## 2019-01-20 NOTE — Assessment & Plan Note (Signed)
Low cholesterol diet and exercise.  Follow lipid panel.   

## 2019-02-13 ENCOUNTER — Encounter: Payer: Self-pay | Admitting: Internal Medicine

## 2019-03-05 ENCOUNTER — Other Ambulatory Visit: Payer: Self-pay | Admitting: Internal Medicine

## 2019-03-05 DIAGNOSIS — Z1231 Encounter for screening mammogram for malignant neoplasm of breast: Secondary | ICD-10-CM

## 2019-03-07 ENCOUNTER — Ambulatory Visit
Admission: RE | Admit: 2019-03-07 | Discharge: 2019-03-07 | Disposition: A | Discharge status: Home / Self Care | Payer: BC Managed Care – PPO | Source: Ambulatory Visit | Attending: Internal Medicine | Admitting: Internal Medicine

## 2019-03-07 ENCOUNTER — Other Ambulatory Visit: Payer: Self-pay

## 2019-03-07 DIAGNOSIS — Z1231 Encounter for screening mammogram for malignant neoplasm of breast: Secondary | ICD-10-CM | POA: Diagnosis present

## 2019-03-11 ENCOUNTER — Encounter: Payer: Self-pay | Admitting: Internal Medicine

## 2019-04-10 ENCOUNTER — Encounter: Payer: Self-pay | Admitting: Internal Medicine

## 2019-04-11 ENCOUNTER — Other Ambulatory Visit: Payer: Self-pay

## 2019-04-11 MED ORDER — LEVOTHYROXINE SODIUM 50 MCG PO TABS: 50.0000 ug | ORAL_TABLET | Freq: Every day | ORAL | 1 refills | Status: DC

## 2019-04-11 MED ORDER — CITALOPRAM HYDROBROMIDE 10 MG PO TABS: 15.0000 mg | ORAL_TABLET | Freq: Every day | ORAL | 2 refills | Status: DC

## 2019-05-21 ENCOUNTER — Encounter: Payer: BC Managed Care – PPO | Admitting: Internal Medicine

## 2019-06-25 ENCOUNTER — Encounter: Payer: Self-pay | Admitting: Internal Medicine

## 2019-06-25 ENCOUNTER — Other Ambulatory Visit: Payer: Self-pay

## 2019-06-25 ENCOUNTER — Ambulatory Visit (INDEPENDENT_AMBULATORY_CARE_PROVIDER_SITE_OTHER): Payer: BC Managed Care – PPO | Admitting: Internal Medicine

## 2019-06-25 VITALS — BP 130/70 | HR 96 | Temp 96.1°F | Resp 16 | Ht 63.0 in | Wt 123.4 lb

## 2019-06-25 DIAGNOSIS — E7800 Pure hypercholesterolemia, unspecified: Secondary | ICD-10-CM | POA: Diagnosis not present

## 2019-06-25 DIAGNOSIS — Z87898 Personal history of other specified conditions: Secondary | ICD-10-CM

## 2019-06-25 DIAGNOSIS — R6884 Jaw pain: Secondary | ICD-10-CM | POA: Diagnosis not present

## 2019-06-25 DIAGNOSIS — Z0000 Encounter for general adult medical examination without abnormal findings: Secondary | ICD-10-CM

## 2019-06-25 DIAGNOSIS — K219 Gastro-esophageal reflux disease without esophagitis: Secondary | ICD-10-CM

## 2019-06-25 DIAGNOSIS — D649 Anemia, unspecified: Secondary | ICD-10-CM

## 2019-06-25 DIAGNOSIS — F502 Bulimia nervosa: Secondary | ICD-10-CM

## 2019-06-25 DIAGNOSIS — E559 Vitamin D deficiency, unspecified: Secondary | ICD-10-CM | POA: Insufficient documentation
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
## 2019-06-26 ENCOUNTER — Encounter: Payer: Self-pay | Admitting: Internal Medicine

## 2019-06-26 LAB — URINALYSIS, ROUTINE W REFLEX MICROSCOPIC
Bilirubin Urine: NEGATIVE
Hgb urine dipstick: NEGATIVE
Ketones, ur: NEGATIVE
Leukocytes,Ua: NEGATIVE
Nitrite: NEGATIVE
Specific Gravity, Urine: 1.015 (ref 1.000–1.030)
Total Protein, Urine: NEGATIVE
Urine Glucose: NEGATIVE
Urobilinogen, UA: 0.2 (ref 0.0–1.0)
WBC, UA: NONE SEEN (ref 0–?)
pH: 7.5 (ref 5.0–8.0)

## 2019-06-26 LAB — URINE CULTURE
MICRO NUMBER:: 10410819
Result:: NO GROWTH
SPECIMEN QUALITY:: ADEQUATE

## 2019-06-27 ENCOUNTER — Encounter: Payer: Self-pay | Admitting: Internal Medicine

## 2019-06-27 ENCOUNTER — Other Ambulatory Visit: Payer: Self-pay

## 2019-06-28 MED ORDER — CHLORHEXIDINE GLUCONATE 0.12 % MT SOLN: 15.0000 mL | Freq: Two times a day (BID) | OROMUCOSAL | 0 refills | Status: DC

## 2019-06-28 NOTE — Telephone Encounter (Signed)
I have sent in the prescription for chlorhexidine.  See my chart message.

## 2019-06-30 ENCOUNTER — Encounter: Payer: Self-pay | Admitting: Internal Medicine

## 2019-06-30 DIAGNOSIS — K219 Gastro-esophageal reflux disease without esophagitis: Secondary | ICD-10-CM | POA: Insufficient documentation

## 2019-06-30 NOTE — Assessment & Plan Note (Signed)
Some intermittent jaw pain.  EKG SR with no acute ischemic changes.  No chest pain with increased activity or exertion.  Discussed with her today.  She is concerned regarding family history and would prefer cardiology referral to discuss her risk and discuss obtaining calcium score.

## 2019-06-30 NOTE — Assessment & Plan Note (Signed)
Follow vitamin D level.  

## 2019-06-30 NOTE — Assessment & Plan Note (Signed)
Follow cbc.  

## 2019-06-30 NOTE — Assessment & Plan Note (Signed)
On citalopram.  Has seen GI.  Stable.  Follow.

## 2019-06-30 NOTE — Assessment & Plan Note (Addendum)
Physical today 06/25/19.  Mammogram 03/08/19 - Birads I.  Colonoscopy 05/2017.  Recommended f/u in 3 years.

## 2019-06-30 NOTE — Assessment & Plan Note (Signed)
Continue PPI.  Request refill for mouth rinse.  Will call back with name.  Follow.

## 2019-06-30 NOTE — Assessment & Plan Note (Signed)
Low cholesterol diet and exercise.  Follow lipid panel.   

## 2019-07-03 ENCOUNTER — Other Ambulatory Visit (INDEPENDENT_AMBULATORY_CARE_PROVIDER_SITE_OTHER): Payer: BC Managed Care – PPO

## 2019-07-03 ENCOUNTER — Other Ambulatory Visit: Payer: Self-pay

## 2019-07-03 DIAGNOSIS — E559 Vitamin D deficiency, unspecified: Secondary | ICD-10-CM | POA: Diagnosis not present

## 2019-07-03 DIAGNOSIS — E7800 Pure hypercholesterolemia, unspecified: Secondary | ICD-10-CM | POA: Diagnosis not present

## 2019-07-03 DIAGNOSIS — D649 Anemia, unspecified: Secondary | ICD-10-CM

## 2019-07-03 LAB — CBC WITH DIFFERENTIAL/PLATELET
Basophils Absolute: 0 10*3/uL (ref 0.0–0.1)
Basophils Relative: 0.3 % (ref 0.0–3.0)
Eosinophils Absolute: 0 10*3/uL (ref 0.0–0.7)
Eosinophils Relative: 0 % (ref 0.0–5.0)
HCT: 37.8 % (ref 36.0–46.0)
Hemoglobin: 12.9 g/dL (ref 12.0–15.0)
Lymphocytes Relative: 26.7 % (ref 12.0–46.0)
Lymphs Abs: 1.8 10*3/uL (ref 0.7–4.0)
MCHC: 34.2 g/dL (ref 30.0–36.0)
MCV: 91.8 fl (ref 78.0–100.0)
Monocytes Absolute: 0.5 10*3/uL (ref 0.1–1.0)
Monocytes Relative: 6.8 % (ref 3.0–12.0)
Neutro Abs: 4.6 10*3/uL (ref 1.4–7.7)
Neutrophils Relative %: 66.2 % (ref 43.0–77.0)
Platelets: 219 10*3/uL (ref 150.0–400.0)
RBC: 4.12 Mil/uL (ref 3.87–5.11)
RDW: 13.5 % (ref 11.5–15.5)
WBC: 6.9 10*3/uL (ref 4.0–10.5)

## 2019-07-03 LAB — HEPATIC FUNCTION PANEL
ALT: 17 U/L (ref 0–35)
AST: 25 U/L (ref 0–37)
Albumin: 4.2 g/dL (ref 3.5–5.2)
Alkaline Phosphatase: 73 U/L (ref 39–117)
Bilirubin, Direct: 0.1 mg/dL (ref 0.0–0.3)
Total Bilirubin: 0.4 mg/dL (ref 0.2–1.2)
Total Protein: 6.8 g/dL (ref 6.0–8.3)

## 2019-07-03 LAB — BASIC METABOLIC PANEL
BUN: 11 mg/dL (ref 6–23)
CO2: 33 mEq/L — ABNORMAL HIGH (ref 19–32)
Calcium: 9.4 mg/dL (ref 8.4–10.5)
Chloride: 102 mEq/L (ref 96–112)
Creatinine, Ser: 1 mg/dL (ref 0.40–1.20)
GFR: 56.39 mL/min — ABNORMAL LOW (ref >60.00)
Glucose, Bld: 87 mg/dL (ref 70–99)
Potassium: 3.7 mEq/L (ref 3.5–5.1)
Sodium: 141 mEq/L (ref 135–145)

## 2019-07-03 LAB — LIPID PANEL
Cholesterol: 268 mg/dL — ABNORMAL HIGH (ref 0–200)
HDL: 35.4 mg/dL — ABNORMAL LOW (ref >39.00)
Total CHOL/HDL Ratio: 8
Triglycerides: 425 mg/dL — ABNORMAL HIGH (ref 0.0–149.0)

## 2019-07-03 LAB — TSH: TSH: 2.72 u[IU]/mL (ref 0.35–4.50)

## 2019-07-03 LAB — VITAMIN D 25 HYDROXY (VIT D DEFICIENCY, FRACTURES): VITD: 30.84 ng/mL (ref 30.00–100.00)

## 2019-07-03 LAB — LDL CHOLESTEROL, DIRECT: Direct LDL: 124 mg/dL

## 2019-07-04 ENCOUNTER — Other Ambulatory Visit: Payer: Self-pay | Admitting: Internal Medicine
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
## 2019-07-10 ENCOUNTER — Encounter: Payer: Self-pay | Admitting: Internal Medicine

## 2019-07-10 ENCOUNTER — Other Ambulatory Visit: Payer: Self-pay

## 2019-07-10 ENCOUNTER — Ambulatory Visit (INDEPENDENT_AMBULATORY_CARE_PROVIDER_SITE_OTHER): Payer: BC Managed Care – PPO | Admitting: Internal Medicine

## 2019-07-10 VITALS — BP 136/90 | HR 81 | Temp 96.0°F | Ht 63.0 in | Wt 121.0 lb

## 2019-07-10 DIAGNOSIS — Z8249 Family history of ischemic heart disease and other diseases of the circulatory system: Secondary | ICD-10-CM | POA: Diagnosis not present

## 2019-07-10 DIAGNOSIS — E782 Mixed hyperlipidemia: Secondary | ICD-10-CM

## 2019-07-10 DIAGNOSIS — R079 Chest pain, unspecified: Secondary | ICD-10-CM

## 2019-07-10 MED ORDER — METOPROLOL TARTRATE 100 MG PO TABS: 100.0000 mg | ORAL_TABLET | Freq: Once | ORAL | 0 refills | Status: DC

## 2019-07-10 NOTE — Patient Instructions (Addendum)
Medication Instructions:  Your physician recommends that you continue on your current medications as directed. Please refer to the Current Medication list given to you today.  *If you need a refill on your cardiac medications before your next appointment, please call your pharmacy*   Lab Work: If Cardiac CTA is after 08/03/19, you will need repeat lab work - BMET- at the [facility]. - Please go to the [facility]. You will check in at the front desk to the right as you walk into the atrium. Valet Parking is offered if needed. - No appointment needed. You may go any day between 7 am and 6 pm.  If you have labs (blood work) drawn today and your tests are completely normal, you will receive your results only by: Marland Kitchen MyChart Message (if you have MyChart) OR . A paper copy in the mail If you have any lab test that is abnormal or we need to change your treatment, we will call you to review the results.   Testing/Procedures:  Your cardiac CT will be scheduled at one of the below locations:   [facility] [address] [place], [place] [address] [phone]  [place] [address] [place], [place] [address] [phone]  If scheduled at [facility], please arrive at the [facility] main entrance of [facility] 30 minutes prior to test start time. Proceed to the [facility] Radiology Department (first floor) to check-in and test prep.  If scheduled at [facility], please arrive 15 mins early for check-in and test prep.  Please follow these instructions carefully (unless otherwise directed):    On the Night Before the Test: . Be sure to Drink plenty of water. . Do not consume any caffeinated/decaffeinated beverages or chocolate 12 hours prior to your test. . Do not take any antihistamines 12 hours prior to your test.  On the Day of the Test: . Drink plenty of  water. Do not drink any water within one hour of the test. . Do not eat any food 4 hours prior to the test. . You may take your regular medications prior to the test.  . Take metoprolol (Lopressor) two hours prior to test. . FEMALES- please wear underwire-free bra if available       After the Test: . Drink plenty of water. . After receiving IV contrast, you may experience a mild flushed feeling. This is normal. . On occasion, you may experience a mild rash up to 24 hours after the test. This is not dangerous. If this occurs, you can take Benadryl 25 mg and increase your fluid intake. . If you experience trouble breathing, this can be serious. If it is severe call 911 IMMEDIATELY. If it is mild, please call our office.  Once we have confirmed authorization from your insurance company, we will call you to set up a date and time for your test.   For non-scheduling related questions, please contact the cardiac imaging nurse navigator should you have any questions/concerns: Marchia Bond, RN Navigator Cardiac Imaging [facility] [phone] office  For scheduling needs, including cancellations and rescheduling, please call [phone].      Follow-Up: At [facility], you and your health needs are our priority.  As part of our continuing mission to provide you with exceptional heart care, we have created designated Provider Care Teams.  These Care Teams include your primary Cardiologist (physician)  and Advanced Practice Providers (APPs -  Physician Assistants and Nurse Practitioners) who all work together to provide you with the care you need, when you need it.  We recommend signing up for the patient portal called "MyChart".  Sign up information is provided on this After Visit Summary.  MyChart is used to connect with patients for Virtual Visits (Telemedicine).  Patients are able to view lab/test results, encounter notes, upcoming appointments, etc.  Non-urgent  messages can be sent to your provider as well.   To learn more about what you can do with MyChart, go to [email or web address].    Your next appointment:   After CT about 6-8 weeks.  The format for your next appointment:   In Person  Provider:    You may see DR Harrell Gave END or one of the following Advanced Practice Providers on your designated Care Team:    Murray Hodgkins, NP  Christell Faith, PA-C  Marrianne Mood, PA-C    Cardiac CT Angiogram A cardiac CT angiogram is a procedure to look at the heart and the area around the heart. It may be done to help find the cause of chest pains or other symptoms of heart disease. During this procedure, a substance called contrast dye is injected into the blood vessels in the area to be checked. A large X-ray machine, called a CT scanner, then takes detailed pictures of the heart and the surrounding area. The procedure is also sometimes called a coronary CT angiogram, coronary artery scanning, or CTA. A cardiac CT angiogram allows the health care provider to see how well blood is flowing to and from the heart. The health care provider will be able to see if there are any problems, such as:  Blockage or narrowing of the coronary arteries in the heart.  Fluid around the heart.  Signs of weakness or disease in the muscles, valves, and tissues of the heart. Tell a health care provider about:  Any allergies you have. This is especially important if you have had a previous allergic reaction to contrast dye.  All medicines you are taking, including vitamins, herbs, eye drops, creams, and over-the-counter medicines.  Any blood disorders you have.  Any surgeries you have had.  Any medical conditions you have.  Whether you are pregnant or may be pregnant.  Any anxiety disorders, chronic pain, or other conditions you have that may increase your stress or prevent you from lying still. What are the risks? Generally, this is a safe procedure.  However, problems may occur, including:  Bleeding.  Infection.  Allergic reactions to medicines or dyes.  Damage to other structures or organs.  Kidney damage from the contrast dye that is used.  Increased risk of cancer from radiation exposure. This risk is low. Talk with your health care provider about: ? The risks and benefits of testing. ? How you can receive the lowest dose of radiation. What happens before the procedure?  Wear comfortable clothing and remove any jewelry, glasses, dentures, and hearing aids.  Follow instructions from your health care provider about eating and drinking. This may include: ? For 12 hours before the procedure -- avoid caffeine. This includes tea, coffee, soda, energy drinks, and diet pills. Drink plenty of water or other fluids that do not have caffeine in them. Being well hydrated can prevent complications. ? For 4-6 hours before the procedure -- stop eating and drinking. The contrast dye can cause nausea, but this is less likely if your stomach is empty.  Ask your health care provider about changing or stopping your regular medicines. This is especially important if you are taking diabetes medicines, blood thinners, or medicines to treat problems with erections (erectile dysfunction). What happens during the procedure?   Hair on your chest may need to be removed so that small sticky patches called electrodes can be placed on your chest. These will transmit information that helps to monitor your heart during the procedure.  An IV will be inserted into one of your veins.  You might be given a medicine to control your heart rate during the procedure. This will help to ensure that good images are obtained.  You will be asked to lie on an exam table. This table will slide in and out of the CT machine during the procedure.  Contrast dye will be injected into the IV. You might feel warm, or you may get a metallic taste in your mouth.  You will be  given a medicine called nitroglycerin. This will relax or dilate the arteries in your heart.  The table that you are lying on will move into the CT machine tunnel for the scan.  The person running the machine will give you instructions while the scans are being done. You may be asked to: ? Keep your arms above your head. ? Hold your breath. ? Stay very still, even if the table is moving.  When the scanning is complete, you will be moved out of the machine.  The IV will be removed. The procedure may vary among health care providers and hospitals. What can I expect after the procedure? After your procedure, it is common to have:  A metallic taste in your mouth from the contrast dye.  A feeling of warmth.  A headache from the nitroglycerin. Follow these instructions at home:  Take over-the-counter and prescription medicines only as told by your health care provider.  If you are told, drink enough fluid to keep your urine pale yellow. This will help to flush the contrast dye out of your body.  Most people can return to their normal activities right after the procedure. Ask your health care provider what activities are safe for you.  It is up to you to get the results of your procedure. Ask your health care provider, or the department that is doing the procedure, when your results will be ready.  Keep all follow-up visits as told by your health care provider. This is important. Contact a health care provider if:  You have any symptoms of allergy to the contrast dye. These include: ? Shortness of breath. ? Rash or hives. ? A racing heartbeat. Summary  A cardiac CT angiogram is a procedure to look at the heart and the area around the heart. It may be done to help find the cause of chest pains or other symptoms of heart disease.  During this procedure, a large X-ray machine, called a CT scanner, takes detailed pictures of the heart and the surrounding area after a contrast dye has  been injected into blood vessels in the area.  Ask your health care provider about changing or stopping your regular medicines before the procedure. This is especially important if you are taking diabetes medicines, blood thinners, or medicines to treat erectile dysfunction.  If you are told, drink enough fluid to keep your urine pale yellow. This will help to flush the contrast dye out of your body. This information is not intended to replace advice given to you by your  health care provider. Make sure you discuss any questions you have with your health care provider. Document Revised: 10/10/2018 Document Reviewed: 10/10/2018 Elsevier Patient Education  [employer].

## 2019-07-11 DIAGNOSIS — E782 Mixed hyperlipidemia: Secondary | ICD-10-CM | POA: Insufficient documentation

## 2019-07-11 DIAGNOSIS — R079 Chest pain, unspecified: Secondary | ICD-10-CM | POA: Insufficient documentation

## 2019-07-11 DIAGNOSIS — Z8249 Family history of ischemic heart disease and other diseases of the circulatory system: Secondary | ICD-10-CM | POA: Insufficient documentation

## 2019-07-23 ENCOUNTER — Telehealth (HOSPITAL_COMMUNITY): Payer: Self-pay | Admitting: *Deleted

## 2019-07-23 ENCOUNTER — Telehealth: Payer: Self-pay | Admitting: General Practice

## 2019-07-23 NOTE — Telephone Encounter (Signed)
Accessed pt's chart to see who called her yesterday.

## 2019-07-23 NOTE — Telephone Encounter (Signed)
Attempted to call patient regarding upcoming cardiac CT appointment. Left message on voicemail with name and callback number  [name] RN Navigator Cardiac [facility] [phone] Office [phone] Cell

## 2019-07-24 ENCOUNTER — Ambulatory Visit (HOSPITAL_COMMUNITY)
Admission: RE | Admit: 2019-07-24 | Discharge: 2019-07-24 | Disposition: A | Discharge status: Home / Self Care | Payer: BC Managed Care – PPO | Source: Ambulatory Visit | Attending: Internal Medicine | Admitting: Internal Medicine

## 2019-07-24 ENCOUNTER — Other Ambulatory Visit: Payer: Self-pay

## 2019-07-24 DIAGNOSIS — Z8249 Family history of ischemic heart disease and other diseases of the circulatory system: Secondary | ICD-10-CM | POA: Diagnosis present

## 2019-07-24 DIAGNOSIS — R079 Chest pain, unspecified: Secondary | ICD-10-CM

## 2019-07-24 MED ORDER — METOPROLOL TARTRATE 5 MG/5ML IV SOLN
INTRAVENOUS | Status: AC
  Administered 2019-07-24: 5 mg via INTRAVENOUS
  Filled 2019-07-24: qty 5

## 2019-07-24 MED ORDER — NITROGLYCERIN 0.4 MG SL SUBL
0.8000 mg | SUBLINGUAL_TABLET | Freq: Once | SUBLINGUAL | Status: AC
  Administered 2019-07-24: 0.8 mg via SUBLINGUAL

## 2019-07-24 MED ORDER — IOHEXOL 350 MG/ML SOLN
100.0000 mL | Freq: Once | INTRAVENOUS | Status: AC | PRN
  Administered 2019-07-24: 100 mL via INTRAVENOUS

## 2019-07-24 MED ORDER — METOPROLOL TARTRATE 5 MG/5ML IV SOLN: 5.0000 mg | INTRAVENOUS | Status: DC | PRN

## 2019-07-24 MED ORDER — NITROGLYCERIN 0.4 MG SL SUBL
SUBLINGUAL_TABLET | SUBLINGUAL | Status: AC
  Filled 2019-07-24: qty 2

## 2019-07-30 ENCOUNTER — Encounter: Payer: Self-pay | Admitting: Internal Medicine

## 2019-07-30 DIAGNOSIS — R9389 Abnormal findings on diagnostic imaging of other specified body structures: Secondary | ICD-10-CM

## 2019-07-31 ENCOUNTER — Telehealth: Payer: Self-pay | Admitting: Internal Medicine

## 2019-07-31 NOTE — Telephone Encounter (Signed)
Let vm for pt to call ofc to obtain information for MRI.

## 2019-07-31 NOTE — Telephone Encounter (Signed)
It looks like you were wanting patient to have MRI done per last CT result note.

## 2019-07-31 NOTE — Telephone Encounter (Signed)
Order placed for MRI

## 2019-08-01 ENCOUNTER — Encounter: Payer: Self-pay | Admitting: Internal Medicine

## 2019-08-02 ENCOUNTER — Telehealth: Payer: Self-pay | Admitting: Internal Medicine

## 2019-08-02 NOTE — Telephone Encounter (Signed)
This pt is going to be gone for most of the summer and would like to have the MRI before she leaves.  Please see her attached messages.  Thanks.

## 2019-08-02 NOTE — Telephone Encounter (Signed)
Error - wrong pt

## 2019-08-02 NOTE — Telephone Encounter (Signed)
Pt called back to speak with you. She said it is very important that you call her back today. Please advise

## 2019-08-02 NOTE — Telephone Encounter (Signed)
Left vm for pt to call ofc regarding MRI.

## 2019-08-02 NOTE — Telephone Encounter (Signed)
Need xrays from ortho - see previous message.

## 2019-08-02 NOTE — Telephone Encounter (Signed)
See attached message

## 2019-08-02 NOTE — Telephone Encounter (Signed)
Ok. Thank you.

## 2019-08-02 NOTE — Telephone Encounter (Signed)
Patient would like a call back from referrrals about her MRI. Phone number is [phone].

## 2019-08-06 NOTE — Telephone Encounter (Signed)
Pt would like a call back

## 2019-08-06 NOTE — Telephone Encounter (Signed)
Pt said she called her insurance they told her it was denied and she wants to know why it was denied. I let her know that you were out of the office and that you will return her call when you return.

## 2019-08-07 ENCOUNTER — Encounter: Payer: Self-pay | Admitting: Internal Medicine

## 2019-08-07 NOTE — Telephone Encounter (Signed)
It is hard to know what is causing a constant ache in her back without evaluation.  If this is new and having constant aching, needs to be evaluated.  Regarding the MRI, Rasheedah had sent a message to me stating she was contacting them about the denial to see what we had to do.  After pt notified of above, please leg Rashedah know about Ms Mccreedy's concern so that we can see if can get approved and scheduled prior to her leaving.  Thanks.

## 2019-08-08 NOTE — Telephone Encounter (Signed)
Left message for patient regarding aching in her back. Routing message to [place] regarding MRI.

## 2019-08-17 ENCOUNTER — Encounter: Payer: Self-pay | Admitting: Internal Medicine

## 2019-08-21 ENCOUNTER — Telehealth: Payer: Self-pay | Admitting: Internal Medicine

## 2019-08-21 DIAGNOSIS — R911 Solitary pulmonary nodule: Secondary | ICD-10-CM

## 2019-08-21 NOTE — Telephone Encounter (Signed)
Per pcp office referral coordinator MRI denied by insurance as ordering md had no ov notes on file to support need.    Dr. Lars Mage office requesting End order as they had a recent ov with him.

## 2019-08-21 NOTE — Telephone Encounter (Signed)
Approved and ready to schedule.

## 2019-08-21 NOTE — Telephone Encounter (Signed)
Spoke with PCP office and they do not have any documentation to support order for MRI and wanted to see since Dr. Saunders Revel saw patient and test was ordered could we try and get this authorized. Spoke with provider here in office and he was agreeable to try and submit order. Will enter order and send to precert for review.

## 2019-08-22 NOTE — Telephone Encounter (Signed)
Yes! [phone] option 3 and then 2. Thank you so much!

## 2019-08-23 ENCOUNTER — Encounter: Payer: Self-pay | Admitting: Internal Medicine

## 2019-08-23 NOTE — Telephone Encounter (Signed)
Mychart sent to patient.

## 2019-08-23 NOTE — Telephone Encounter (Signed)
Thank you.  Let me know if I need to do anything.

## 2019-08-23 NOTE — Telephone Encounter (Signed)
Noted. Please notify pt that they should be calling to get scheduled.

## 2019-08-23 NOTE — Telephone Encounter (Signed)
See message to pt.

## 2019-08-26 ENCOUNTER — Telehealth: Payer: Self-pay | Admitting: Internal Medicine

## 2019-08-26 NOTE — Telephone Encounter (Signed)
Patient calling to check on status of having MRI scheduled - patient states insurance has approved  If possible patient would prefer July 28th, 29th or 30th Please call to discuss / schedule

## 2019-08-26 NOTE — Telephone Encounter (Signed)
Spoke with patient and reviewed that procedure was approved and to call number to get scheduled. She was not able to take number but offered to send My Chart message with that information. She verbalized understanding with no further questions at this time.

## 2019-08-26 NOTE — Telephone Encounter (Signed)
Message sent to patient via mychart.   No further orders at this time.

## 2019-09-05 ENCOUNTER — Telehealth (INDEPENDENT_AMBULATORY_CARE_PROVIDER_SITE_OTHER): Payer: BC Managed Care – PPO | Admitting: Nurse Practitioner

## 2019-09-05 ENCOUNTER — Other Ambulatory Visit: Payer: Self-pay

## 2019-09-05 ENCOUNTER — Encounter: Payer: Self-pay | Admitting: Nurse Practitioner

## 2019-09-05 VITALS — HR 86 | Ht 63.0 in | Wt 123.0 lb

## 2019-09-05 DIAGNOSIS — R0789 Other chest pain: Secondary | ICD-10-CM | POA: Diagnosis not present
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
## 2019-09-05 NOTE — Patient Instructions (Signed)
Medication Instructions:  Your physician recommends that you continue on your current medications as directed. Please refer to the Current Medication list given to you today.  *If you need a refill on your cardiac medications before your next appointment, please call your pharmacy*   Lab Work: NONE If you have labs (blood work) drawn today and your tests are completely normal, you will receive your results only by: Marland Kitchen MyChart Message (if you have MyChart) OR . A paper copy in the mail If you have any lab test that is abnormal or we need to change your treatment, we will call you to review the results.   Testing/Procedures: NONE   Follow-Up: At [facility], you and your health needs are our priority.  As part of our continuing mission to provide you with exceptional heart care, we have created designated Provider Care Teams.  These Care Teams include your primary Cardiologist (physician) and Advanced Practice Providers (APPs -  Physician Assistants and Nurse Practitioners) who all work together to provide you with the care you need, when you need it.  We recommend signing up for the patient portal called "MyChart".  Sign up information is provided on this After Visit Summary.  MyChart is used to connect with patients for Virtual Visits (Telemedicine).  Patients are able to view lab/test results, encounter notes, upcoming appointments, etc.  Non-urgent messages can be sent to your provider as well.   To learn more about what you can do with MyChart, go to [email or web address].    Your next appointment:   AS NEEDED  The format for your next appointment:   Either In Person or Virtual  Provider:    You may see [name], MD  or one of the following Advanced Practice Providers on your designated Care Team:    Murray Hodgkins, NP  Christell Faith, PA-C  Marrianne Mood, PA-C

## 2019-09-26 ENCOUNTER — Ambulatory Visit
Admission: RE | Admit: 2019-09-26 | Discharge: 2019-09-26 | Disposition: A | Discharge status: Home / Self Care | Payer: BC Managed Care – PPO | Source: Ambulatory Visit | Attending: Internal Medicine | Admitting: Internal Medicine

## 2019-09-26 ENCOUNTER — Other Ambulatory Visit: Payer: Self-pay

## 2019-09-26 DIAGNOSIS — R911 Solitary pulmonary nodule: Secondary | ICD-10-CM | POA: Insufficient documentation

## 2019-09-26 MED ORDER — GADOBUTROL 1 MMOL/ML IV SOLN
5.0000 mL | Freq: Once | INTRAVENOUS | Status: AC | PRN
  Administered 2019-09-26: 5 mL via INTRAVENOUS

## 2019-09-30 ENCOUNTER — Telehealth: Payer: Self-pay | Admitting: Internal Medicine

## 2019-09-30 DIAGNOSIS — D492 Neoplasm of unspecified behavior of bone, soft tissue, and skin: Secondary | ICD-10-CM

## 2019-09-30 NOTE — Telephone Encounter (Signed)
MRI results not reviewed by Dr End yet. She is aware that once he results the MRI with his recommendations, then we will let her know the results. She was very Patent attorney.

## 2019-09-30 NOTE — Telephone Encounter (Signed)
Attempted to reach patient to discuss results of T-spine MRI.  Left voicemail asking patient to contact the office.  I will also attempt to reach out to her again later today or tomorrow as well.  [name], MD [facility]

## 2019-09-30 NOTE — Telephone Encounter (Signed)
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  She has noted some pain in her mid back in that region.  I will reach out to Dr. Nicki Reaper regarding her thoughts, though I would favor referral to a neurosurgeon for further evaluation and recommendations regarding how best to manage this finding.  Linda Perez is in agreement.  [name], MD [facility]

## 2019-09-30 NOTE — Telephone Encounter (Signed)
Patient would like MRI results. States she will be out of range due to traveling to [facility], and request a call back within 2 hours.

## 2019-10-01 NOTE — Telephone Encounter (Signed)
Thank you for the follow up.  We will schedule her to see a neurosurgeon for further evaluation.    Thank you again for seeing her and taking care of her.  Adeliz Tonkinson

## 2019-10-01 NOTE — Telephone Encounter (Signed)
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  She will need to see neurosurgery.  I can place order for referral.  Let me know if she has a preference of which neurosurgeon she sees.  Thanks.

## 2019-10-02 NOTE — Telephone Encounter (Signed)
Order placed for [facility] referral.  

## 2019-10-02 NOTE — Telephone Encounter (Signed)
Patient does not have a preference, she is fine to go wherever you would like to send her

## 2019-10-02 NOTE — Telephone Encounter (Signed)
Order placed for neurosurgery referral 

## 2019-10-02 NOTE — Addendum Note (Signed)
Addended by: Alisa Graff on: 10/02/2019 11:50 AM   Modules accepted: Orders

## 2019-10-05 ENCOUNTER — Other Ambulatory Visit: Payer: Self-pay | Admitting: Internal Medicine

## 2019-10-28 ENCOUNTER — Telehealth: Payer: BC Managed Care – PPO | Admitting: Internal Medicine

## 2019-11-06 ENCOUNTER — Encounter: Payer: Self-pay | Admitting: Internal Medicine

## 2019-11-07 ENCOUNTER — Telehealth (INDEPENDENT_AMBULATORY_CARE_PROVIDER_SITE_OTHER): Payer: BC Managed Care – PPO | Admitting: Internal Medicine

## 2019-11-07 DIAGNOSIS — D334 Benign neoplasm of spinal cord: Secondary | ICD-10-CM

## 2019-11-07 DIAGNOSIS — F502 Bulimia nervosa: Secondary | ICD-10-CM

## 2019-11-07 DIAGNOSIS — D649 Anemia, unspecified: Secondary | ICD-10-CM

## 2019-11-07 DIAGNOSIS — E7800 Pure hypercholesterolemia, unspecified: Secondary | ICD-10-CM | POA: Diagnosis not present

## 2019-11-07 DIAGNOSIS — K209 Esophagitis, unspecified without bleeding: Secondary | ICD-10-CM | POA: Diagnosis not present
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
## 2019-11-14 ENCOUNTER — Other Ambulatory Visit: Payer: Self-pay | Admitting: Neurosurgery

## 2019-11-14 DIAGNOSIS — D492 Neoplasm of unspecified behavior of bone, soft tissue, and skin: Secondary | ICD-10-CM

## 2019-11-16 ENCOUNTER — Encounter: Payer: Self-pay | Admitting: Internal Medicine

## 2019-11-17 ENCOUNTER — Encounter: Payer: Self-pay | Admitting: Internal Medicine

## 2019-11-17 DIAGNOSIS — D334 Benign neoplasm of spinal cord: Secondary | ICD-10-CM | POA: Insufficient documentation

## 2019-11-17 NOTE — Assessment & Plan Note (Signed)
Saw neurosurgery 10/24/19.  Recommended f/u in 2 months.

## 2019-11-17 NOTE — Assessment & Plan Note (Signed)
No upper symptoms reported.  Has been evaluated by GI.  On protonix.  Continue.

## 2019-11-17 NOTE — Assessment & Plan Note (Signed)
On citalopram.  Has seen GI.  Follow.

## 2019-11-17 NOTE — Assessment & Plan Note (Signed)
Follow cbc.  

## 2019-11-17 NOTE — Assessment & Plan Note (Signed)
Low cholesterol diet and exercise.  Follow lipid panel.   

## 2019-11-19 ENCOUNTER — Other Ambulatory Visit: Payer: Self-pay

## 2019-11-19 MED ORDER — PANTOPRAZOLE SODIUM 40 MG PO TBEC: 40.0000 mg | DELAYED_RELEASE_TABLET | Freq: Every day | ORAL | 1 refills | Status: DC

## 2019-11-19 NOTE — Progress Notes (Signed)
rx refilled.

## 2019-12-26 IMAGING — MG DIGITAL SCREENING BILATERAL MAMMOGRAM WITH TOMO AND CAD
8 series · 9 of 24 positions shown · non-contrast
Comparison: Previous exam(s).

CLINICAL DATA: Screening.

EXAM:
DIGITAL SCREENING BILATERAL MAMMOGRAM WITH TOMO AND CAD

[L MLO synth-2D]
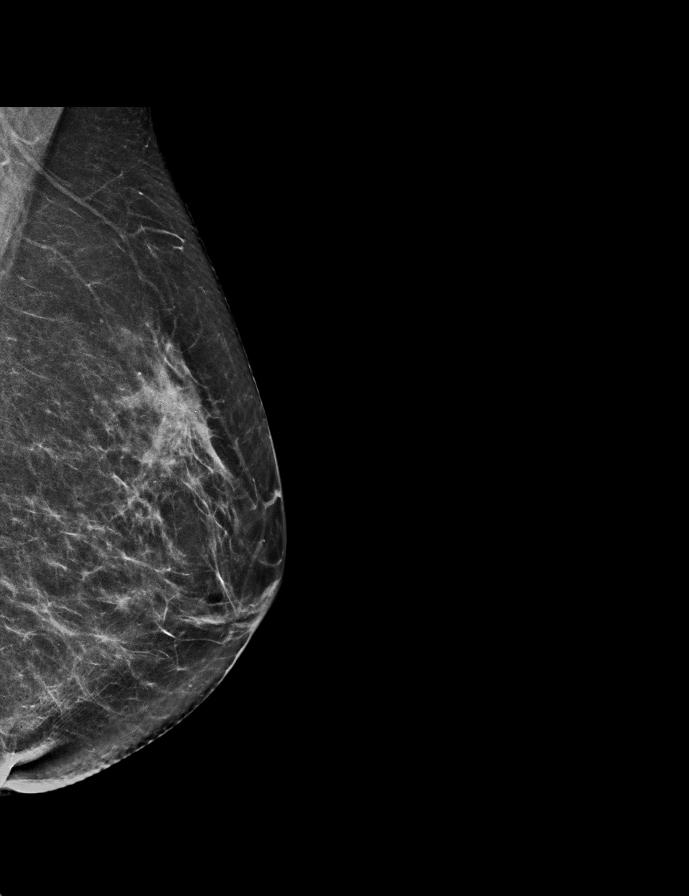

[L CC synth-2D]
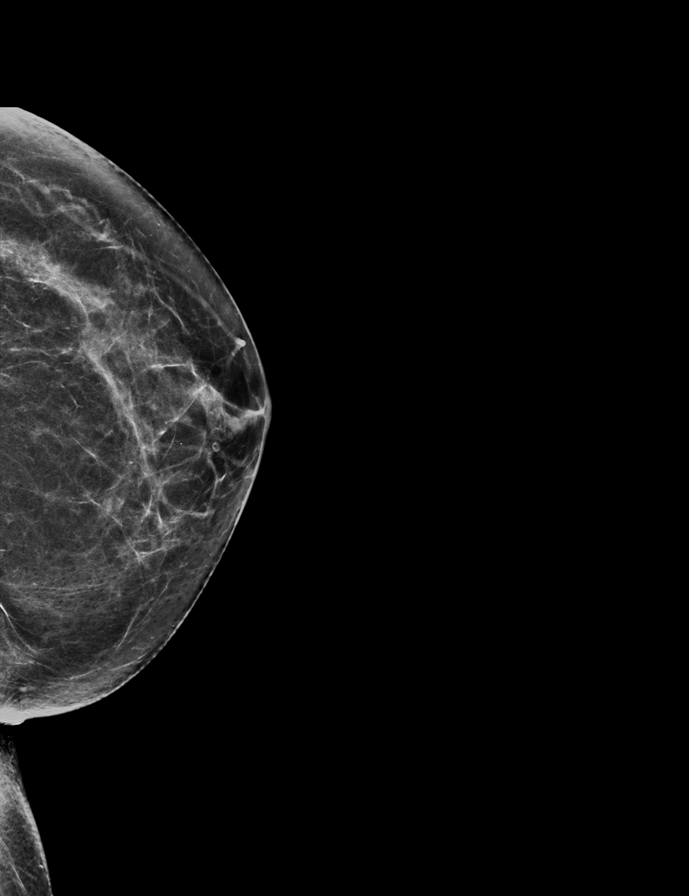

[R MLO synth-2D]
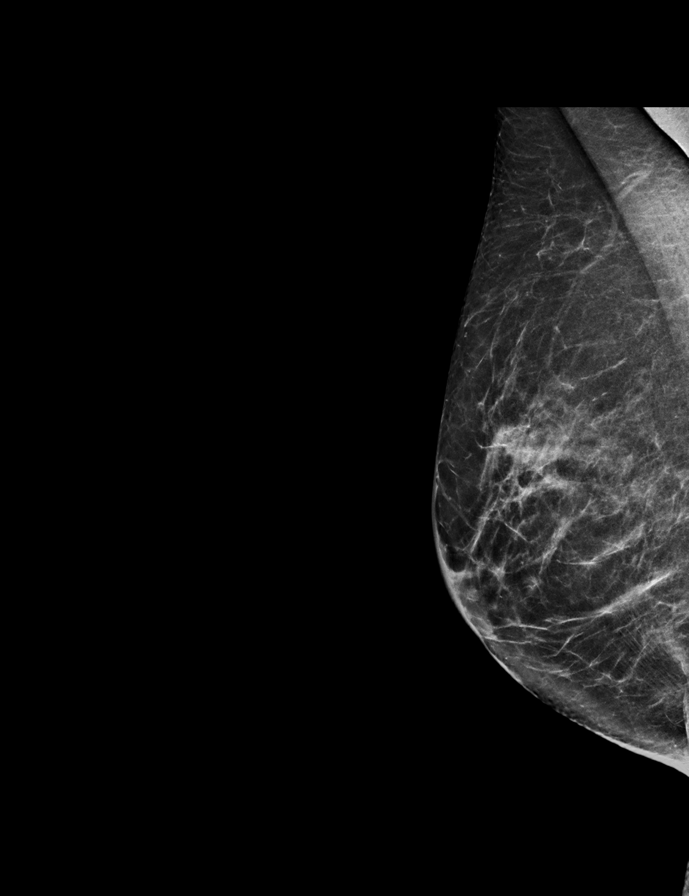

[R CC synth-2D]
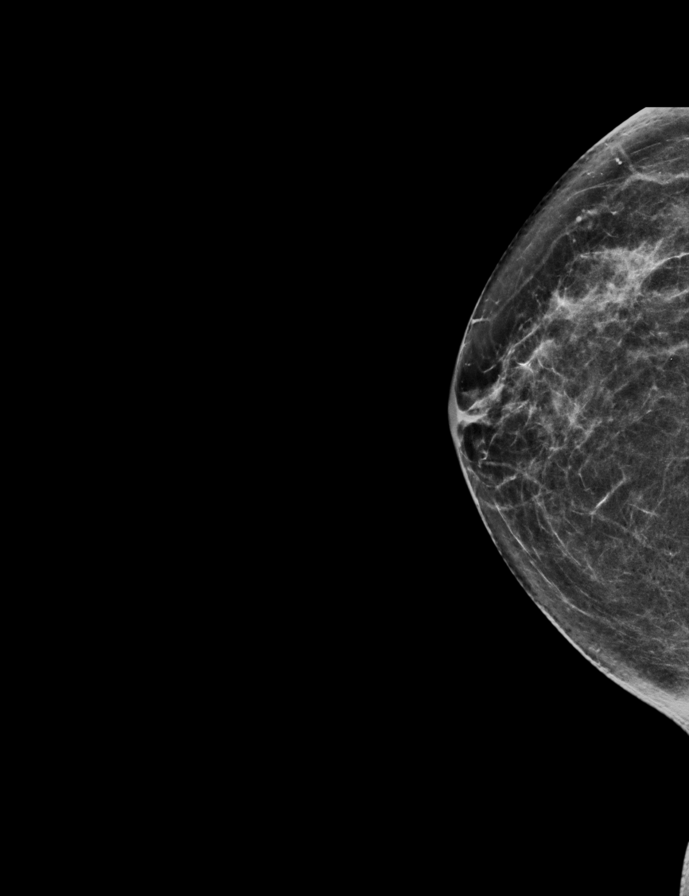

[R MLO tomo · 2 of 62 frames shown]
[frame 21/62]
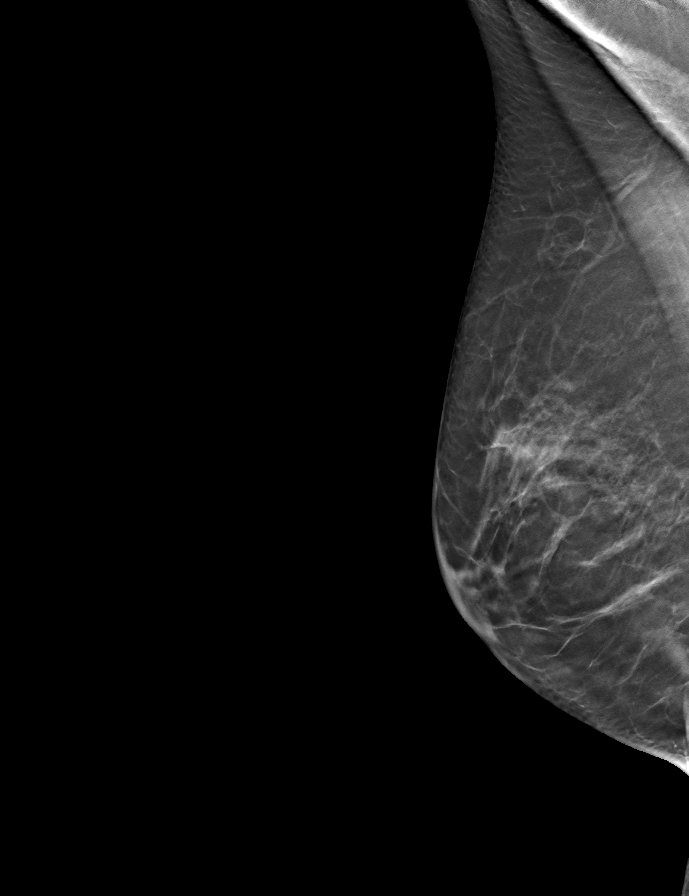
[frame 31/62]
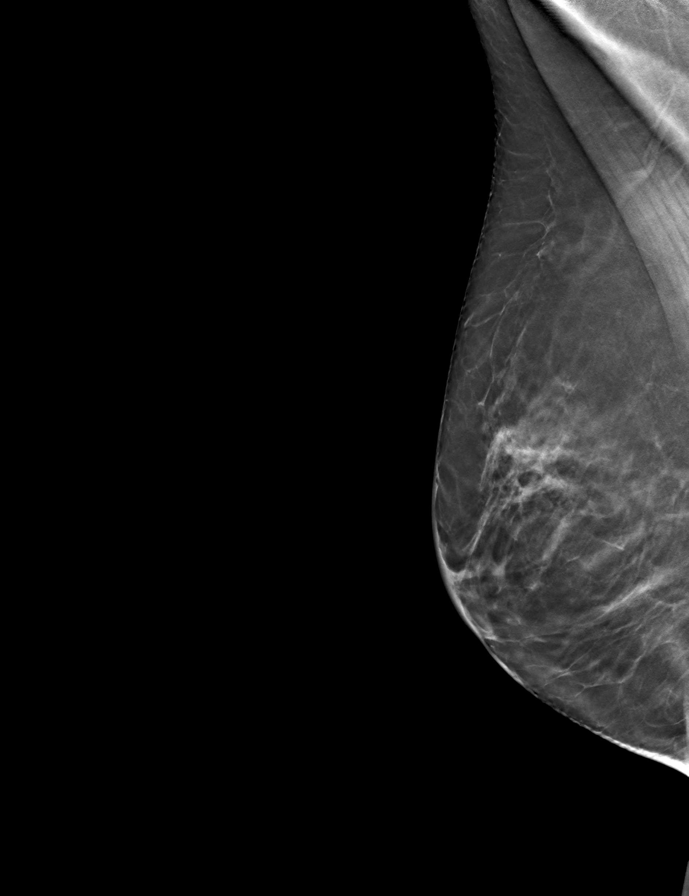

[L MLO tomo · tomo slice 31/62.0]
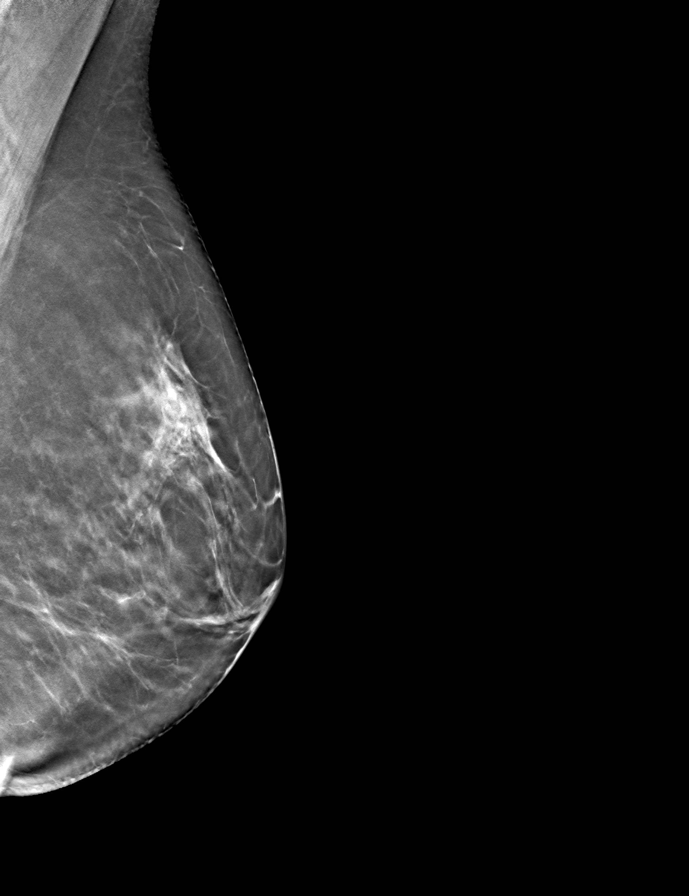

[L CC tomo · tomo slice 33/64.0]
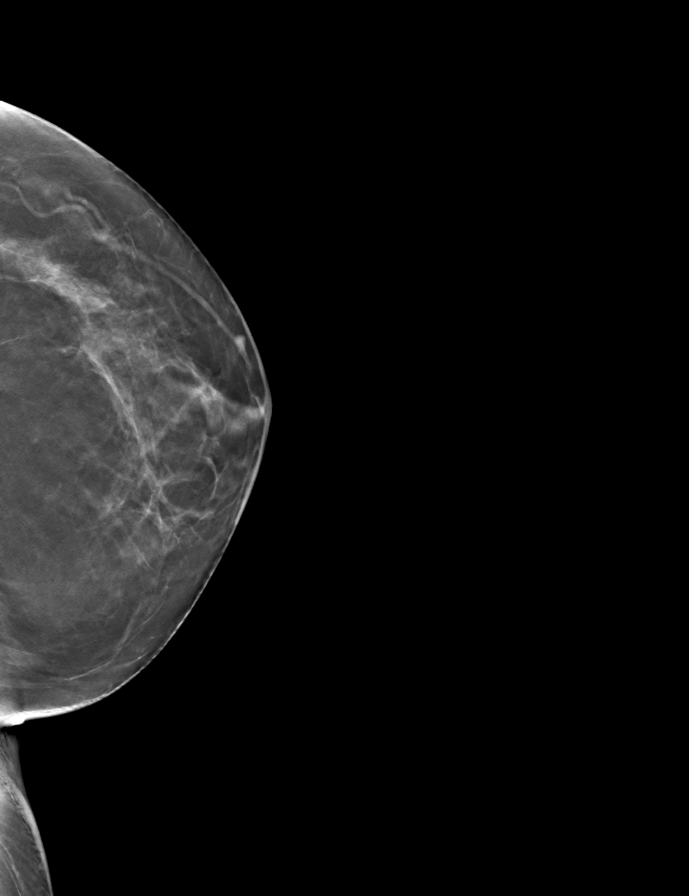

[R CC tomo · tomo slice 31/61.0]
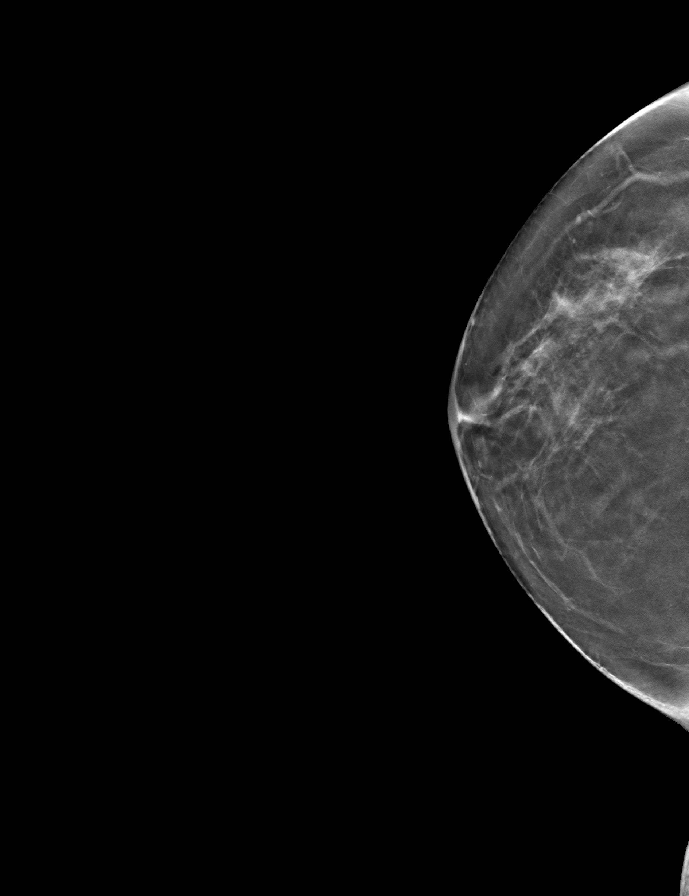

[9 of 24 positions shown; findings below may reference images not displayed]

ACR Breast Density Category c: The breast tissue is heterogeneously
dense, which may obscure small masses.
FINDINGS: There are no findings suspicious for malignancy. Images were
processed with CAD.
IMPRESSION: No mammographic evidence of malignancy. A result letter of this
screening mammogram will be mailed directly to the patient.

RECOMMENDATION:
Screening mammogram in one year. (Code:[id number])

BI-RADS CATEGORY  1: Negative.

## 2020-01-07 ENCOUNTER — Ambulatory Visit
Admission: RE | Admit: 2020-01-07 | Discharge: 2020-01-07 | Disposition: A | Discharge status: Home / Self Care | Payer: BC Managed Care – PPO | Source: Ambulatory Visit | Attending: Neurosurgery | Admitting: Neurosurgery

## 2020-01-07 ENCOUNTER — Other Ambulatory Visit: Payer: Self-pay | Admitting: Internal Medicine

## 2020-01-07 ENCOUNTER — Other Ambulatory Visit: Payer: Self-pay

## 2020-01-07 DIAGNOSIS — D492 Neoplasm of unspecified behavior of bone, soft tissue, and skin: Secondary | ICD-10-CM | POA: Diagnosis present

## 2020-01-07 MED ORDER — GADOBUTROL 1 MMOL/ML IV SOLN
5.0000 mL | Freq: Once | INTRAVENOUS | Status: AC | PRN
  Administered 2020-01-07: 5 mL via INTRAVENOUS

## 2020-01-14 ENCOUNTER — Other Ambulatory Visit: Payer: Self-pay | Admitting: Internal Medicine

## 2020-02-05 ENCOUNTER — Other Ambulatory Visit: Payer: Self-pay | Admitting: Internal Medicine

## 2020-02-05 DIAGNOSIS — Z1231 Encounter for screening mammogram for malignant neoplasm of breast: Secondary | ICD-10-CM

## 2020-03-09 ENCOUNTER — Ambulatory Visit
Admission: RE | Admit: 2020-03-09 | Discharge: 2020-03-09 | Disposition: A | Discharge status: Home / Self Care | Payer: BC Managed Care – PPO | Source: Ambulatory Visit | Attending: Internal Medicine | Admitting: Internal Medicine

## 2020-03-09 ENCOUNTER — Other Ambulatory Visit: Payer: Self-pay

## 2020-03-09 DIAGNOSIS — Z1231 Encounter for screening mammogram for malignant neoplasm of breast: Secondary | ICD-10-CM | POA: Diagnosis not present

## 2020-04-30 ENCOUNTER — Encounter: Payer: Self-pay | Admitting: Internal Medicine

## 2020-04-30 ENCOUNTER — Other Ambulatory Visit: Payer: Self-pay | Admitting: Internal Medicine

## 2020-05-04 ENCOUNTER — Other Ambulatory Visit: Payer: Self-pay

## 2020-05-04 MED ORDER — LEVOTHYROXINE SODIUM 50 MCG PO TABS: ORAL_TABLET | ORAL | 1 refills | Status: DC

## 2020-09-12 ENCOUNTER — Other Ambulatory Visit: Payer: Self-pay | Admitting: Internal Medicine

## 2020-10-06 ENCOUNTER — Other Ambulatory Visit: Payer: Self-pay | Admitting: Internal Medicine

## 2020-10-19 ENCOUNTER — Encounter: Payer: Self-pay | Admitting: Internal Medicine

## 2020-10-20 NOTE — Telephone Encounter (Signed)
Need more information on this pt.  Need specific symptoms, when symptoms started any associated symptoms.

## 2020-10-20 NOTE — Telephone Encounter (Signed)
Please call pt and find out specific symptoms having and when started.  See previous message.

## 2020-10-20 NOTE — Telephone Encounter (Signed)
Left message for patient to return call back. Also sending mychart message to patient.

## 2020-10-21 NOTE — Telephone Encounter (Unsigned)
I can work her in for evaluation.  Not here tomorrow.  If she is ok to wait, I can work her in Friday 10/23/20

## 2020-10-21 NOTE — Telephone Encounter (Signed)
LMTCB

## 2020-10-21 NOTE — Telephone Encounter (Signed)
See other note

## 2020-10-23 ENCOUNTER — Encounter: Payer: Self-pay | Admitting: Internal Medicine

## 2020-10-23 ENCOUNTER — Other Ambulatory Visit: Payer: Self-pay

## 2020-10-23 ENCOUNTER — Ambulatory Visit (INDEPENDENT_AMBULATORY_CARE_PROVIDER_SITE_OTHER): Payer: BC Managed Care – PPO | Admitting: Internal Medicine

## 2020-10-23 VITALS — BP 122/76 | HR 90 | Temp 97.0°F | Resp 16 | Ht 63.0 in | Wt 116.6 lb

## 2020-10-23 DIAGNOSIS — N644 Mastodynia: Secondary | ICD-10-CM

## 2020-10-23 DIAGNOSIS — R634 Abnormal weight loss: Secondary | ICD-10-CM

## 2020-10-23 DIAGNOSIS — E7800 Pure hypercholesterolemia, unspecified: Secondary | ICD-10-CM

## 2020-10-23 DIAGNOSIS — E559 Vitamin D deficiency, unspecified: Secondary | ICD-10-CM | POA: Diagnosis not present

## 2020-10-23 DIAGNOSIS — K219 Gastro-esophageal reflux disease without esophagitis: Secondary | ICD-10-CM

## 2020-10-23 DIAGNOSIS — D649 Anemia, unspecified: Secondary | ICD-10-CM | POA: Diagnosis not present

## 2020-10-23 LAB — HEPATIC FUNCTION PANEL
ALT: 19 U/L (ref 0–35)
AST: 27 U/L (ref 0–37)
Albumin: 4.3 g/dL (ref 3.5–5.2)
Alkaline Phosphatase: 74 U/L (ref 39–117)
Bilirubin, Direct: 0.1 mg/dL (ref 0.0–0.3)
Total Bilirubin: 0.6 mg/dL (ref 0.2–1.2)
Total Protein: 7 g/dL (ref 6.0–8.3)

## 2020-10-23 LAB — BASIC METABOLIC PANEL
BUN: 12 mg/dL (ref 6–23)
CO2: 33 mEq/L — ABNORMAL HIGH (ref 19–32)
Calcium: 9.7 mg/dL (ref 8.4–10.5)
Chloride: 100 mEq/L (ref 96–112)
Creatinine, Ser: 1.18 mg/dL (ref 0.40–1.20)
GFR: 49.62 mL/min — ABNORMAL LOW (ref >60.00)
Glucose, Bld: 70 mg/dL (ref 70–99)
Potassium: 3.7 mEq/L (ref 3.5–5.1)
Sodium: 142 mEq/L (ref 135–145)

## 2020-10-23 LAB — CBC WITH DIFFERENTIAL/PLATELET
Basophils Absolute: 0 10*3/uL (ref 0.0–0.1)
Basophils Relative: 0.2 % (ref 0.0–3.0)
Eosinophils Absolute: 0 10*3/uL (ref 0.0–0.7)
Eosinophils Relative: 0 % (ref 0.0–5.0)
HCT: 39.2 % (ref 36.0–46.0)
Hemoglobin: 13.4 g/dL (ref 12.0–15.0)
Lymphocytes Relative: 27.6 % (ref 12.0–46.0)
Lymphs Abs: 1.7 10*3/uL (ref 0.7–4.0)
MCHC: 34.2 g/dL (ref 30.0–36.0)
MCV: 90.4 fl (ref 78.0–100.0)
Monocytes Absolute: 0.4 10*3/uL (ref 0.1–1.0)
Monocytes Relative: 6.8 % (ref 3.0–12.0)
Neutro Abs: 4.1 10*3/uL (ref 1.4–7.7)
Neutrophils Relative %: 65.4 % (ref 43.0–77.0)
Platelets: 230 10*3/uL (ref 150.0–400.0)
RBC: 4.34 Mil/uL (ref 3.87–5.11)
RDW: 12.9 % (ref 11.5–15.5)
WBC: 6.3 10*3/uL (ref 4.0–10.5)

## 2020-10-23 LAB — VITAMIN D 25 HYDROXY (VIT D DEFICIENCY, FRACTURES): VITD: 27.73 ng/mL — ABNORMAL LOW (ref 30.00–100.00)

## 2020-10-23 LAB — LIPID PANEL
Cholesterol: 281 mg/dL — ABNORMAL HIGH (ref 0–200)
HDL: 39.6 mg/dL (ref >39.00)
NonHDL: 241.21
Total CHOL/HDL Ratio: 7
Triglycerides: 301 mg/dL — ABNORMAL HIGH (ref 0.0–149.0)
VLDL: 60.2 mg/dL — ABNORMAL HIGH (ref 0.0–40.0)

## 2020-10-23 LAB — LDL CHOLESTEROL, DIRECT: Direct LDL: 150 mg/dL

## 2020-10-23 LAB — TSH: TSH: 2.07 u[IU]/mL (ref 0.35–5.50)

## 2020-10-23 NOTE — Assessment & Plan Note (Signed)
Right breast tenderness as outlined.  Check right breast diagnostic mammogram and possible ultrasound.  No rash.  No redness.

## 2020-10-23 NOTE — Assessment & Plan Note (Signed)
Follow lipid panel.  Recheck today.

## 2020-10-23 NOTE — Assessment & Plan Note (Signed)
Continue PPI ?

## 2020-10-23 NOTE — Assessment & Plan Note (Signed)
Recheck vitamin D level today. Taking supplements.

## 2020-10-23 NOTE — Assessment & Plan Note (Signed)
Weight is down a few more pounds.  Increased stress as outlined.  She has good support.  Is eating and doing better now.  Check routine labs and follow.
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
## 2020-10-23 NOTE — Assessment & Plan Note (Signed)
Check cbc today 

## 2020-10-26 ENCOUNTER — Other Ambulatory Visit

## 2020-10-26 ENCOUNTER — Encounter

## 2020-10-27 ENCOUNTER — Encounter: Payer: Self-pay | Admitting: Internal Medicine

## 2020-10-28 ENCOUNTER — Ambulatory Visit
Admission: RE | Admit: 2020-10-28 | Discharge: 2020-10-28 | Disposition: A | Discharge status: Home / Self Care | Payer: BC Managed Care – PPO | Source: Ambulatory Visit | Attending: Internal Medicine | Admitting: Internal Medicine

## 2020-10-28 ENCOUNTER — Other Ambulatory Visit: Payer: Self-pay

## 2020-10-28 DIAGNOSIS — N644 Mastodynia: Secondary | ICD-10-CM

## 2020-10-28 NOTE — Telephone Encounter (Signed)
See result note.  

## 2020-11-24 ENCOUNTER — Telehealth: Payer: Self-pay | Admitting: Internal Medicine

## 2020-11-24 DIAGNOSIS — E7800 Pure hypercholesterolemia, unspecified: Secondary | ICD-10-CM

## 2020-11-24 NOTE — Telephone Encounter (Signed)
Pt had a lab appt 11/26/2086, there is no orders in yet.

## 2020-11-25 ENCOUNTER — Other Ambulatory Visit: Payer: Self-pay

## 2020-11-25 ENCOUNTER — Other Ambulatory Visit (INDEPENDENT_AMBULATORY_CARE_PROVIDER_SITE_OTHER): Payer: BC Managed Care – PPO

## 2020-11-25 DIAGNOSIS — R944 Abnormal results of kidney function studies: Secondary | ICD-10-CM

## 2020-11-25 DIAGNOSIS — E7800 Pure hypercholesterolemia, unspecified: Secondary | ICD-10-CM

## 2020-11-25 NOTE — Addendum Note (Signed)
Addended by: Alisa Graff on: 11/25/2020 04:01 AM   Modules accepted: Orders

## 2020-11-25 NOTE — Addendum Note (Signed)
Addended by: Leeanne Rio on: 11/25/2020 04:06 PM   Modules accepted: Orders

## 2020-11-25 NOTE — Telephone Encounter (Signed)
Order placed for f/u labs.  

## 2020-11-26 LAB — BASIC METABOLIC PANEL
BUN: 9 mg/dL (ref 6–23)
CO2: 31 mEq/L (ref 19–32)
Calcium: 9.3 mg/dL (ref 8.4–10.5)
Chloride: 101 mEq/L (ref 96–112)
Creatinine, Ser: 1.03 mg/dL (ref 0.40–1.20)
GFR: 58.38 mL/min — ABNORMAL LOW (ref >60.00)
Glucose, Bld: 82 mg/dL (ref 70–99)
Potassium: 3.6 mEq/L (ref 3.5–5.1)
Sodium: 139 mEq/L (ref 135–145)

## 2020-11-26 LAB — URINALYSIS, ROUTINE W REFLEX MICROSCOPIC
Bilirubin Urine: NEGATIVE
Ketones, ur: NEGATIVE
Nitrite: NEGATIVE
Specific Gravity, Urine: 1.01 (ref 1.000–1.030)
Total Protein, Urine: NEGATIVE
Urine Glucose: NEGATIVE
Urobilinogen, UA: 0.2 (ref 0.0–1.0)
pH: 7 (ref 5.0–8.0)

## 2020-11-27 ENCOUNTER — Other Ambulatory Visit: Payer: Self-pay | Admitting: Internal Medicine

## 2020-11-27 DIAGNOSIS — R319 Hematuria, unspecified: Secondary | ICD-10-CM

## 2020-11-27 NOTE — Progress Notes (Signed)
Order placed for f/u urine and culture.

## 2020-12-17 ENCOUNTER — Other Ambulatory Visit (INDEPENDENT_AMBULATORY_CARE_PROVIDER_SITE_OTHER): Payer: BC Managed Care – PPO

## 2020-12-17 ENCOUNTER — Other Ambulatory Visit: Payer: Self-pay

## 2020-12-17 DIAGNOSIS — R319 Hematuria, unspecified: Secondary | ICD-10-CM

## 2020-12-17 DIAGNOSIS — E7800 Pure hypercholesterolemia, unspecified: Secondary | ICD-10-CM

## 2020-12-18 LAB — URINALYSIS, ROUTINE W REFLEX MICROSCOPIC
Bilirubin Urine: NEGATIVE
Ketones, ur: NEGATIVE
Leukocytes,Ua: NEGATIVE
Nitrite: NEGATIVE
Specific Gravity, Urine: 1.02 (ref 1.000–1.030)
Total Protein, Urine: NEGATIVE
Urine Glucose: NEGATIVE
Urobilinogen, UA: 0.2 (ref 0.0–1.0)
pH: 7 (ref 5.0–8.0)

## 2020-12-18 LAB — URINE CULTURE
MICRO NUMBER:: 12529371
Result:: NO GROWTH
SPECIMEN QUALITY:: ADEQUATE

## 2020-12-23 NOTE — Progress Notes (Signed)
Left message for patient to return call back for lab results.

## 2020-12-28 NOTE — Telephone Encounter (Signed)
Lab results were given to the patient she will wait until her next appointment to discuss renal ultrasound.

## 2021-01-27 ENCOUNTER — Encounter: Admitting: Internal Medicine

## 2021-01-30 ENCOUNTER — Encounter: Payer: Self-pay | Admitting: Internal Medicine

## 2021-02-01 MED ORDER — LEVOTHYROXINE SODIUM 50 MCG PO TABS: ORAL_TABLET | ORAL | 1 refills | Status: DC

## 2021-02-01 NOTE — Telephone Encounter (Signed)
Called patient looks like she soul have been out of meds 9/22 based on our records. She states she has not missed any doses and has been taking as directed. Ok to refill?

## 2021-02-01 NOTE — Telephone Encounter (Signed)
Rx sent in for thyroid medication.  Keep f/u appt.

## 2021-02-16 ENCOUNTER — Other Ambulatory Visit (HOSPITAL_COMMUNITY)
Admission: RE | Admit: 2021-02-16 | Discharge: 2021-02-16 | Disposition: A | Discharge status: Home / Self Care | Payer: BC Managed Care – PPO | Source: Ambulatory Visit | Attending: Internal Medicine | Admitting: Internal Medicine

## 2021-02-16 ENCOUNTER — Other Ambulatory Visit: Payer: Self-pay

## 2021-02-16 ENCOUNTER — Encounter: Payer: Self-pay | Admitting: Internal Medicine

## 2021-02-16 ENCOUNTER — Telehealth: Payer: Self-pay | Admitting: Internal Medicine

## 2021-02-16 ENCOUNTER — Ambulatory Visit (INDEPENDENT_AMBULATORY_CARE_PROVIDER_SITE_OTHER): Payer: BC Managed Care – PPO | Admitting: Internal Medicine

## 2021-02-16 VITALS — BP 108/70 | HR 90 | Temp 97.9°F | Resp 16 | Ht 63.0 in | Wt 116.4 lb

## 2021-02-16 DIAGNOSIS — Z124 Encounter for screening for malignant neoplasm of cervix: Secondary | ICD-10-CM | POA: Insufficient documentation

## 2021-02-16 DIAGNOSIS — Z1231 Encounter for screening mammogram for malignant neoplasm of breast: Secondary | ICD-10-CM | POA: Diagnosis not present

## 2021-02-16 DIAGNOSIS — D334 Benign neoplasm of spinal cord: Secondary | ICD-10-CM

## 2021-02-16 DIAGNOSIS — F418 Other specified anxiety disorders: Secondary | ICD-10-CM | POA: Diagnosis not present

## 2021-02-16 DIAGNOSIS — Z0000 Encounter for general adult medical examination without abnormal findings: Secondary | ICD-10-CM

## 2021-02-16 DIAGNOSIS — D649 Anemia, unspecified: Secondary | ICD-10-CM

## 2021-02-16 DIAGNOSIS — R0989 Other specified symptoms and signs involving the circulatory and respiratory systems: Secondary | ICD-10-CM | POA: Insufficient documentation

## 2021-02-16 DIAGNOSIS — K219 Gastro-esophageal reflux disease without esophagitis: Secondary | ICD-10-CM

## 2021-02-16 DIAGNOSIS — E7800 Pure hypercholesterolemia, unspecified: Secondary | ICD-10-CM

## 2021-02-16 DIAGNOSIS — R82998 Other abnormal findings in urine: Secondary | ICD-10-CM

## 2021-02-16 DIAGNOSIS — E559 Vitamin D deficiency, unspecified: Secondary | ICD-10-CM

## 2021-02-16 DIAGNOSIS — F502 Bulimia nervosa, unspecified: Secondary | ICD-10-CM

## 2021-02-16 NOTE — Assessment & Plan Note (Signed)
Follow cbc.  

## 2021-02-16 NOTE — Assessment & Plan Note (Signed)
No acid reflux.  No protonix.

## 2021-02-16 NOTE — Assessment & Plan Note (Signed)
Last urine - no red blood cells.  Evaluated kidneys on abdominal ultrasound.

## 2021-02-16 NOTE — Telephone Encounter (Signed)
I have placed an order for her mammogram.  Please provide her with number to schedule.  Also, she previously saw neurosurgery for tumor on spine.  It appears she is overdue appt.  If agreeable, will need f/u scheduled with Dr Izora Ribas - Neurosurgery - [facility].
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
## 2021-02-16 NOTE — Assessment & Plan Note (Signed)
Increased stress. Discussed.  Plans to contact her therapist.  Follow. Continue citalopram.

## 2021-02-16 NOTE — Assessment & Plan Note (Signed)
Has seen GI previously.  Increased vomiting recently.  Planning to see her therapist.  Referring back to GI - due colonoscopy.  D/w GI regarding need for EGD given history.

## 2021-02-16 NOTE — Assessment & Plan Note (Signed)
Follow lipid panel.   

## 2021-02-16 NOTE — Assessment & Plan Note (Signed)
Previously saw [facility] (Dr Izora Ribas).  Appears she is overdue f/u.  D/w her regarding scheduling f/u appt.

## 2021-02-16 NOTE — Assessment & Plan Note (Addendum)
Physical today 02/16/21.  PAP 02/16/21. Mammogram due 02/2021.  Colonoscopy 05/2017.  Recommended f/u in 3 years.

## 2021-02-16 NOTE — Assessment & Plan Note (Signed)
Continue vitamin d supplements.  Follow.

## 2021-02-16 NOTE — Assessment & Plan Note (Signed)
Obtain ultrasound to evaluate aorta.

## 2021-02-18 LAB — CYTOLOGY - PAP
Comment: NEGATIVE
Diagnosis: NEGATIVE
High risk HPV: NEGATIVE

## 2021-02-18 NOTE — Telephone Encounter (Signed)
Called patient. She would like to call and schedule her own mammogram and neurosurgery appt because she does not have her calendar with her

## 2021-02-19 ENCOUNTER — Ambulatory Visit
Admission: RE | Admit: 2021-02-19 | Discharge: 2021-02-19 | Disposition: A | Discharge status: Home / Self Care | Payer: BC Managed Care – PPO | Source: Ambulatory Visit | Attending: Internal Medicine | Admitting: Internal Medicine

## 2021-02-19 ENCOUNTER — Other Ambulatory Visit: Payer: Self-pay

## 2021-02-19 DIAGNOSIS — R0989 Other specified symptoms and signs involving the circulatory and respiratory systems: Secondary | ICD-10-CM | POA: Diagnosis present

## 2021-02-19 DIAGNOSIS — R82998 Other abnormal findings in urine: Secondary | ICD-10-CM | POA: Insufficient documentation

## 2021-03-03 ENCOUNTER — Encounter: Payer: Self-pay | Admitting: Internal Medicine

## 2021-03-04 ENCOUNTER — Other Ambulatory Visit: Payer: Self-pay

## 2021-03-04 MED ORDER — PANTOPRAZOLE SODIUM 40 MG PO TBEC: DELAYED_RELEASE_TABLET | ORAL | 2 refills | Status: DC

## 2021-03-09 ENCOUNTER — Encounter: Payer: Self-pay | Admitting: Internal Medicine

## 2021-03-10 ENCOUNTER — Other Ambulatory Visit: Payer: Self-pay

## 2021-03-10 MED ORDER — PAXLOVID (150/100) 10 X 150 MG & 10 X 100MG PO TBPK
ORAL_TABLET | ORAL | 0 refills | Status: DC
  Filled 2021-03-10: qty 20, 5d supply, fill #0

## 2021-03-10 NOTE — Telephone Encounter (Signed)
Left message to call office

## 2021-03-10 NOTE — Telephone Encounter (Signed)
Patient called back and canceled appointment, due to she was prescribed paxlovid by another provider.

## 2021-03-10 NOTE — Telephone Encounter (Signed)
Spoke to nurse.  Pt has an appt tomorrow to discuss.  In no distress.

## 2021-03-10 NOTE — Telephone Encounter (Signed)
Noted.  To call if problems.

## 2021-03-10 NOTE — Telephone Encounter (Signed)
Patient returned office phone call. 

## 2021-03-11 ENCOUNTER — Telehealth: Payer: BC Managed Care – PPO | Admitting: Internal Medicine

## 2021-04-26 ENCOUNTER — Encounter: Payer: Self-pay | Admitting: Internal Medicine

## 2021-05-06 ENCOUNTER — Encounter: Payer: Self-pay | Admitting: Internal Medicine

## 2021-05-17 ENCOUNTER — Other Ambulatory Visit: Payer: Self-pay

## 2021-05-17 ENCOUNTER — Other Ambulatory Visit (INDEPENDENT_AMBULATORY_CARE_PROVIDER_SITE_OTHER): Payer: BC Managed Care – PPO

## 2021-05-17 DIAGNOSIS — E7800 Pure hypercholesterolemia, unspecified: Secondary | ICD-10-CM

## 2021-05-17 LAB — HEPATIC FUNCTION PANEL
ALT: 17 U/L (ref 0–35)
AST: 25 U/L (ref 0–37)
Albumin: 4.4 g/dL (ref 3.5–5.2)
Alkaline Phosphatase: 77 U/L (ref 39–117)
Bilirubin, Direct: 0.1 mg/dL (ref 0.0–0.3)
Total Bilirubin: 0.5 mg/dL (ref 0.2–1.2)
Total Protein: 6.6 g/dL (ref 6.0–8.3)

## 2021-05-17 LAB — BASIC METABOLIC PANEL
BUN: 14 mg/dL (ref 6–23)
CO2: 32 mEq/L (ref 19–32)
Calcium: 9.1 mg/dL (ref 8.4–10.5)
Chloride: 102 mEq/L (ref 96–112)
Creatinine, Ser: 1.09 mg/dL (ref 0.40–1.20)
GFR: 54.36 mL/min — ABNORMAL LOW (ref >60.00)
Glucose, Bld: 81 mg/dL (ref 70–99)
Potassium: 3.4 mEq/L — ABNORMAL LOW (ref 3.5–5.1)
Sodium: 141 mEq/L (ref 135–145)

## 2021-05-17 LAB — LIPID PANEL
Cholesterol: 271 mg/dL — ABNORMAL HIGH (ref 0–200)
HDL: 35.7 mg/dL — ABNORMAL LOW (ref >39.00)
NonHDL: 234.87
Total CHOL/HDL Ratio: 8
Triglycerides: 380 mg/dL — ABNORMAL HIGH (ref 0.0–149.0)
VLDL: 76 mg/dL — ABNORMAL HIGH (ref 0.0–40.0)

## 2021-05-17 LAB — LDL CHOLESTEROL, DIRECT: Direct LDL: 148 mg/dL

## 2021-05-21 ENCOUNTER — Other Ambulatory Visit: Payer: Self-pay

## 2021-05-21 ENCOUNTER — Ambulatory Visit
Admission: RE | Admit: 2021-05-21 | Discharge: 2021-05-21 | Disposition: A | Discharge status: Home / Self Care | Payer: BC Managed Care – PPO | Source: Ambulatory Visit | Attending: Internal Medicine | Admitting: Internal Medicine

## 2021-05-21 DIAGNOSIS — Z1231 Encounter for screening mammogram for malignant neoplasm of breast: Secondary | ICD-10-CM

## 2021-05-26 ENCOUNTER — Other Ambulatory Visit: Payer: Self-pay | Admitting: Internal Medicine

## 2021-06-22 ENCOUNTER — Encounter: Payer: Self-pay | Admitting: Internal Medicine

## 2021-06-22 ENCOUNTER — Ambulatory Visit (INDEPENDENT_AMBULATORY_CARE_PROVIDER_SITE_OTHER): Payer: BC Managed Care – PPO | Admitting: Internal Medicine

## 2021-06-22 DIAGNOSIS — F502 Bulimia nervosa: Secondary | ICD-10-CM

## 2021-06-22 DIAGNOSIS — K209 Esophagitis, unspecified without bleeding: Secondary | ICD-10-CM

## 2021-06-22 DIAGNOSIS — E559 Vitamin D deficiency, unspecified: Secondary | ICD-10-CM

## 2021-06-22 DIAGNOSIS — D334 Benign neoplasm of spinal cord: Secondary | ICD-10-CM | POA: Diagnosis not present

## 2021-06-22 DIAGNOSIS — E7800 Pure hypercholesterolemia, unspecified: Secondary | ICD-10-CM

## 2021-06-22 DIAGNOSIS — D649 Anemia, unspecified: Secondary | ICD-10-CM | POA: Diagnosis not present

## 2021-06-22 DIAGNOSIS — Z86010 Personal history of colonic polyps: Secondary | ICD-10-CM
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
## 2021-07-03 ENCOUNTER — Encounter: Payer: Self-pay | Admitting: Internal Medicine

## 2021-07-03 NOTE — Assessment & Plan Note (Signed)
Follow cbc.  

## 2021-07-03 NOTE — Assessment & Plan Note (Signed)
Follow vitamin d level.   

## 2021-07-03 NOTE — Assessment & Plan Note (Addendum)
Follow lipid panel. ? The 10-year ASCVD risk score (Arnett DK, et al., 2019) is: 6.1% ?  Values used to calculate the score: ?    Age: 61 years ?    Sex: Female ?    Is Non-Hispanic African American: No ?    Diabetic: No ?    Tobacco smoker: No ?    Systolic Blood Pressure: 248 mmHg ?    Is BP treated: No ?    HDL Cholesterol: 35.7 mg/dL ?    Total Cholesterol: 271 mg/dL ?

## 2021-07-03 NOTE — Assessment & Plan Note (Signed)
Previously saw [facility] (Dr Izora Ribas).  Need to confirm f/u.  ?

## 2021-07-03 NOTE — Assessment & Plan Note (Signed)
Worsened recently with increased stress.  Discussed f/u with counselor.  See if can get established with a counselor.  Follow.  ?

## 2021-07-03 NOTE — Assessment & Plan Note (Signed)
Colonoscopy 2021.  Due f/u 5-7 years.  

## 2021-07-03 NOTE — Assessment & Plan Note (Signed)
No upper symptoms reported.  Has been evaluated by GI.  On protonix.  Has f/u with GI next week.  ?

## 2021-09-03 ENCOUNTER — Other Ambulatory Visit: Payer: Self-pay | Admitting: Internal Medicine

## 2021-09-03 ENCOUNTER — Other Ambulatory Visit: Payer: Self-pay

## 2021-09-03 ENCOUNTER — Encounter: Payer: Self-pay | Admitting: Internal Medicine

## 2021-09-03 MED ORDER — LEVOTHYROXINE SODIUM 50 MCG PO TABS: ORAL_TABLET | ORAL | 1 refills | Status: DC

## 2021-10-06 ENCOUNTER — Other Ambulatory Visit: Payer: Self-pay | Admitting: Internal Medicine

## 2021-10-18 ENCOUNTER — Other Ambulatory Visit (INDEPENDENT_AMBULATORY_CARE_PROVIDER_SITE_OTHER): Payer: BC Managed Care – PPO

## 2021-10-18 DIAGNOSIS — E559 Vitamin D deficiency, unspecified: Secondary | ICD-10-CM

## 2021-10-18 DIAGNOSIS — D649 Anemia, unspecified: Secondary | ICD-10-CM | POA: Diagnosis not present

## 2021-10-18 DIAGNOSIS — E7800 Pure hypercholesterolemia, unspecified: Secondary | ICD-10-CM | POA: Diagnosis not present

## 2021-10-18 LAB — CBC WITH DIFFERENTIAL/PLATELET
Basophils Absolute: 0 10*3/uL (ref 0.0–0.1)
Basophils Relative: 0.1 % (ref 0.0–3.0)
Eosinophils Absolute: 0 10*3/uL (ref 0.0–0.7)
Eosinophils Relative: 0.4 % (ref 0.0–5.0)
HCT: 37.4 % (ref 36.0–46.0)
Hemoglobin: 13.2 g/dL (ref 12.0–15.0)
Lymphocytes Relative: 29.5 % (ref 12.0–46.0)
Lymphs Abs: 2 10*3/uL (ref 0.7–4.0)
MCHC: 35.3 g/dL (ref 30.0–36.0)
MCV: 88.8 fl (ref 78.0–100.0)
Monocytes Absolute: 0.4 10*3/uL (ref 0.1–1.0)
Monocytes Relative: 6.5 % (ref 3.0–12.0)
Neutro Abs: 4.4 10*3/uL (ref 1.4–7.7)
Neutrophils Relative %: 63.5 % (ref 43.0–77.0)
Platelets: 216 10*3/uL (ref 150.0–400.0)
RBC: 4.21 Mil/uL (ref 3.87–5.11)
RDW: 13.5 % (ref 11.5–15.5)
WBC: 6.9 10*3/uL (ref 4.0–10.5)

## 2021-10-18 LAB — LIPID PANEL
Cholesterol: 296 mg/dL — ABNORMAL HIGH (ref 0–200)
HDL: 41.1 mg/dL (ref >39.00)
NonHDL: 254.69
Total CHOL/HDL Ratio: 7
Triglycerides: 296 mg/dL — ABNORMAL HIGH (ref 0.0–149.0)
VLDL: 59.2 mg/dL — ABNORMAL HIGH (ref 0.0–40.0)

## 2021-10-18 LAB — BASIC METABOLIC PANEL
BUN: 13 mg/dL (ref 6–23)
CO2: 31 mEq/L (ref 19–32)
Calcium: 9.1 mg/dL (ref 8.4–10.5)
Chloride: 102 mEq/L (ref 96–112)
Creatinine, Ser: 0.92 mg/dL (ref 0.40–1.20)
GFR: 66.43 mL/min (ref >60.00)
Glucose, Bld: 86 mg/dL (ref 70–99)
Potassium: 3.3 mEq/L — ABNORMAL LOW (ref 3.5–5.1)
Sodium: 143 mEq/L (ref 135–145)

## 2021-10-18 LAB — TSH: TSH: 4.17 u[IU]/mL (ref 0.35–5.50)

## 2021-10-18 LAB — LDL CHOLESTEROL, DIRECT: Direct LDL: 181 mg/dL

## 2021-10-18 LAB — HEPATIC FUNCTION PANEL
ALT: 16 U/L (ref 0–35)
AST: 19 U/L (ref 0–37)
Albumin: 4.1 g/dL (ref 3.5–5.2)
Alkaline Phosphatase: 74 U/L (ref 39–117)
Bilirubin, Direct: 0.1 mg/dL (ref 0.0–0.3)
Total Bilirubin: 0.5 mg/dL (ref 0.2–1.2)
Total Protein: 6.9 g/dL (ref 6.0–8.3)

## 2021-10-18 LAB — VITAMIN D 25 HYDROXY (VIT D DEFICIENCY, FRACTURES): VITD: 26.26 ng/mL — ABNORMAL LOW (ref 30.00–100.00)

## 2021-10-22 ENCOUNTER — Ambulatory Visit (INDEPENDENT_AMBULATORY_CARE_PROVIDER_SITE_OTHER): Payer: BC Managed Care – PPO | Admitting: Internal Medicine

## 2021-10-22 ENCOUNTER — Encounter: Payer: Self-pay | Admitting: Internal Medicine

## 2021-10-22 ENCOUNTER — Ambulatory Visit (INDEPENDENT_AMBULATORY_CARE_PROVIDER_SITE_OTHER): Payer: BC Managed Care – PPO

## 2021-10-22 VITALS — BP 120/82 | HR 92 | Temp 97.9°F | Ht 63.0 in | Wt 113.0 lb

## 2021-10-22 DIAGNOSIS — E7800 Pure hypercholesterolemia, unspecified: Secondary | ICD-10-CM

## 2021-10-22 DIAGNOSIS — E2839 Other primary ovarian failure: Secondary | ICD-10-CM | POA: Diagnosis not present

## 2021-10-22 DIAGNOSIS — M25511 Pain in right shoulder: Secondary | ICD-10-CM | POA: Diagnosis not present

## 2021-10-22 DIAGNOSIS — D334 Benign neoplasm of spinal cord: Secondary | ICD-10-CM

## 2021-10-22 DIAGNOSIS — E559 Vitamin D deficiency, unspecified: Secondary | ICD-10-CM

## 2021-10-22 DIAGNOSIS — E876 Hypokalemia: Secondary | ICD-10-CM | POA: Diagnosis not present

## 2021-10-22 DIAGNOSIS — Z860100 Personal history of colon polyps, unspecified: Secondary | ICD-10-CM

## 2021-10-22 DIAGNOSIS — N3010 Interstitial cystitis (chronic) without hematuria: Secondary | ICD-10-CM

## 2021-10-22 DIAGNOSIS — K219 Gastro-esophageal reflux disease without esophagitis: Secondary | ICD-10-CM

## 2021-10-22 DIAGNOSIS — F502 Bulimia nervosa, unspecified: Secondary | ICD-10-CM

## 2021-10-22 DIAGNOSIS — Z8249 Family history of ischemic heart disease and other diseases of the circulatory system: Secondary | ICD-10-CM

## 2021-10-22 NOTE — Assessment & Plan Note (Addendum)
The 10-year ASCVD risk score (Arnett DK, et al., 2019) is: 5.9%   Values used to calculate the score:     Age: 61 years     Sex: Female     Is Non-Hispanic African American: No     Diabetic: No     Tobacco smoker: No     Systolic Blood Pressure: 586 mmHg     Is BP treated: No     HDL Cholesterol: 41.1 mg/dL     Total Cholesterol: 296 mg/dL  Given family history, discussed risk factor modification.  Discussed calcium score.  Obtain CT screening for calcium score.
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
## 2021-10-25 ENCOUNTER — Telehealth: Payer: Self-pay | Admitting: *Deleted

## 2021-10-25 ENCOUNTER — Encounter: Payer: Self-pay | Admitting: *Deleted

## 2021-10-25 NOTE — Telephone Encounter (Signed)
LMTCB & sent mychart message to discuss results below:  Your shoulder xray reveals some mild arthritis changes.  Given persistent pain, would like to refer to Physical Therapy (PT) for further evaluation and treatment.

## 2021-10-28 ENCOUNTER — Telehealth: Payer: Self-pay | Admitting: Internal Medicine

## 2021-10-28 NOTE — Telephone Encounter (Signed)
Patient has a lab appt 11/03/2021, there are no orders in.

## 2021-10-29 DIAGNOSIS — E876 Hypokalemia: Secondary | ICD-10-CM | POA: Insufficient documentation

## 2021-10-29 NOTE — Telephone Encounter (Signed)
Orders placed for labs

## 2021-11-01 ENCOUNTER — Telehealth: Payer: Self-pay | Admitting: Internal Medicine

## 2021-11-01 ENCOUNTER — Encounter: Payer: Self-pay | Admitting: Internal Medicine

## 2021-11-01 NOTE — Assessment & Plan Note (Signed)
Remains on imipramine and vesicare.  Stable.  

## 2021-11-01 NOTE — Assessment & Plan Note (Signed)
Discussed her eating issues.  Follow potassium level.

## 2021-11-01 NOTE — Assessment & Plan Note (Signed)
Follow vitamin D level.  

## 2021-11-01 NOTE — Assessment & Plan Note (Signed)
Persistent pain.  EKG - SR with no acute ischemic changes.  Plan xray.  Further w/up pending results.

## 2021-11-01 NOTE — Assessment & Plan Note (Signed)
Discussed with her today. Discussed f/u counseling.

## 2021-11-01 NOTE — Assessment & Plan Note (Signed)
Previously saw [facility] (Dr Izora Ribas).  Confirm if f/u recommended.

## 2021-11-01 NOTE — Assessment & Plan Note (Signed)
Colonoscopy 2021.  Due f/u 5-7 years.  

## 2021-11-01 NOTE — Telephone Encounter (Signed)
Previously saw Dr Izora Ribas - schwannoma (back). Need to confirm with pt if she has f/u scheduled or if she was supposed to f/u with him.

## 2021-11-01 NOTE — Assessment & Plan Note (Signed)
Obtain screening CT - calcium score.

## 2021-11-01 NOTE — Assessment & Plan Note (Signed)
Seeing GI.  On protonix - '40mg'$  q day.  Follow.

## 2021-11-02 NOTE — Telephone Encounter (Signed)
Lvm for patient to call back to see if she is scheduled or has seen Dr. Rogene Houston of Neurology.  Markasia Carrol,cma

## 2021-11-03 ENCOUNTER — Telehealth: Payer: Self-pay

## 2021-11-03 DIAGNOSIS — D3610 Benign neoplasm of peripheral nerves and autonomic nervous system, unspecified: Secondary | ICD-10-CM

## 2021-11-03 NOTE — Telephone Encounter (Signed)
Order has been placed. I will work on the authorization.

## 2021-11-03 NOTE — Telephone Encounter (Signed)
-----   Message from Peggyann Shoals sent at 11/03/2021  3:35 PM EDT ----- Regarding: order for Contact: Hasley Canyon called on behalf of the patient. She is due for a 2 year fu for a schwannoma last seen on 10/24/2019. She needs a scan and follow-up appt.

## 2021-11-03 NOTE — Telephone Encounter (Signed)
Pattie with Dr Rhea Bleacher office called.  They will contact Ms Gilles with an appt

## 2021-11-03 NOTE — Telephone Encounter (Signed)
I called and spoke with the patient and she stated she needed a follow up and she called and left messages on their VM to call her to schedule and noone ever called her back.  She stated she wants a follow up with Dr. Cari Caraway.  Dusty Raczkowski,cma

## 2021-11-03 NOTE — Telephone Encounter (Signed)
I called Dr Conley Rolls office and left message for them to schedule a f/u appt.   Please hold until return call with appt time.

## 2021-11-04 ENCOUNTER — Other Ambulatory Visit (INDEPENDENT_AMBULATORY_CARE_PROVIDER_SITE_OTHER): Payer: BC Managed Care – PPO

## 2021-11-04 DIAGNOSIS — E876 Hypokalemia: Secondary | ICD-10-CM | POA: Diagnosis not present

## 2021-11-05 ENCOUNTER — Encounter: Payer: Self-pay | Admitting: Internal Medicine

## 2021-11-05 DIAGNOSIS — F502 Bulimia nervosa: Secondary | ICD-10-CM

## 2021-11-05 LAB — MAGNESIUM: Magnesium: 2 mg/dL (ref 1.5–2.5)

## 2021-11-05 LAB — POTASSIUM: Potassium: 3.5 mEq/L (ref 3.5–5.1)

## 2021-11-05 NOTE — Telephone Encounter (Signed)
Okay to place referral last ov:10/22/21

## 2021-11-06 NOTE — Addendum Note (Signed)
Addended by: Alisa Graff on: 11/06/2021 08:51 AM   Modules accepted: Orders

## 2021-11-06 NOTE — Telephone Encounter (Signed)
Order placed for referral to [facility] eating disorders clinic.

## 2021-11-12 ENCOUNTER — Other Ambulatory Visit: Payer: Self-pay

## 2021-11-12 ENCOUNTER — Telehealth: Payer: Self-pay

## 2021-11-12 DIAGNOSIS — E876 Hypokalemia: Secondary | ICD-10-CM

## 2021-11-12 NOTE — Telephone Encounter (Signed)
-----   Message from Einar Pheasant, MD sent at 11/12/2021 12:53 PM EDT ----- Continue potassium supplements for now.  Would like to recheck potassium in 2 weeks to confirm stable.

## 2021-11-12 NOTE — Telephone Encounter (Signed)
LMTCB for Continue potassium supplements for now.  Would like to recheck potassium in 2 weeks to confirm stable.

## 2021-11-12 NOTE — Telephone Encounter (Signed)
Patient returned office phone call. Lab orders are needed in order to schedule lab appointment.

## 2021-11-12 NOTE — Progress Notes (Signed)
Potassium ordered for repeat in 2 wks.

## 2021-11-18 ENCOUNTER — Telehealth: Payer: Self-pay | Admitting: Internal Medicine

## 2021-11-18 DIAGNOSIS — E876 Hypokalemia: Secondary | ICD-10-CM

## 2021-11-18 NOTE — Telephone Encounter (Signed)
Patient has a lab appt 11/23/21, there are no orders in.

## 2021-11-19 NOTE — Telephone Encounter (Signed)
Order placed for f/u potassium check.  

## 2021-11-19 NOTE — Addendum Note (Signed)
Addended by: Alisa Graff on: 11/19/2021 03:50 AM   Modules accepted: Orders

## 2021-11-23 ENCOUNTER — Ambulatory Visit
Admission: RE | Admit: 2021-11-23 | Discharge: 2021-11-23 | Disposition: A | Discharge status: Home / Self Care | Payer: BC Managed Care – PPO | Source: Ambulatory Visit | Attending: Internal Medicine | Admitting: Internal Medicine

## 2021-11-23 ENCOUNTER — Other Ambulatory Visit (INDEPENDENT_AMBULATORY_CARE_PROVIDER_SITE_OTHER): Payer: BC Managed Care – PPO

## 2021-11-23 ENCOUNTER — Ambulatory Visit
Admission: RE | Admit: 2021-11-23 | Discharge: 2021-11-23 | Disposition: A | Discharge status: Home / Self Care | Payer: BC Managed Care – PPO | Source: Ambulatory Visit | Attending: Neurosurgery | Admitting: Neurosurgery

## 2021-11-23 DIAGNOSIS — E876 Hypokalemia: Secondary | ICD-10-CM

## 2021-11-23 DIAGNOSIS — D3610 Benign neoplasm of peripheral nerves and autonomic nervous system, unspecified: Secondary | ICD-10-CM | POA: Diagnosis present

## 2021-11-23 DIAGNOSIS — Z8249 Family history of ischemic heart disease and other diseases of the circulatory system: Secondary | ICD-10-CM | POA: Insufficient documentation

## 2021-11-23 LAB — POTASSIUM: Potassium: 3.2 mEq/L — ABNORMAL LOW (ref 3.5–5.1)

## 2021-11-23 MED ORDER — GADOPICLENOL 0.5 MMOL/ML IV SOLN
5.0000 mL | Freq: Once | INTRAVENOUS | Status: AC | PRN
  Administered 2021-11-23: 5 mL via INTRAVENOUS

## 2021-11-24 ENCOUNTER — Telehealth: Payer: Self-pay

## 2021-11-24 NOTE — Telephone Encounter (Signed)
LMTCB for results of CT calcium scire.

## 2021-11-25 ENCOUNTER — Ambulatory Visit
Admission: RE | Admit: 2021-11-25 | Discharge: 2021-11-25 | Disposition: A | Discharge status: Home / Self Care | Payer: BC Managed Care – PPO | Source: Ambulatory Visit | Attending: Internal Medicine | Admitting: Internal Medicine

## 2021-11-25 DIAGNOSIS — E2839 Other primary ovarian failure: Secondary | ICD-10-CM | POA: Insufficient documentation

## 2021-11-26 ENCOUNTER — Other Ambulatory Visit: Payer: Self-pay | Admitting: *Deleted

## 2021-11-26 MED ORDER — POTASSIUM CHLORIDE CRYS ER 10 MEQ PO TBCR: 10.0000 meq | EXTENDED_RELEASE_TABLET | Freq: Every day | ORAL | 2 refills | Status: DC

## 2021-11-26 NOTE — Addendum Note (Signed)
Addended by: Leeanne Rio on: 11/26/2021 12:33 PM   Modules accepted: Orders

## 2021-12-02 ENCOUNTER — Ambulatory Visit (INDEPENDENT_AMBULATORY_CARE_PROVIDER_SITE_OTHER): Payer: BC Managed Care – PPO | Admitting: Neurosurgery

## 2021-12-02 DIAGNOSIS — D3610 Benign neoplasm of peripheral nerves and autonomic nervous system, unspecified: Secondary | ICD-10-CM

## 2021-12-02 NOTE — Addendum Note (Signed)
Addended by: Berdine Addison on: 12/02/2021 06:24 PM   Modules accepted: Orders

## 2021-12-02 NOTE — Progress Notes (Signed)
Linda Perez is doing well.  She has no symptoms.  Exam performed today as this was a telephone visit.  MRI findings reviewed IMPRESSION: 1. 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 2. Stable small disc protrusions at T6-7, T8-9 and T10-11.     Electronically Signed   By: San Morelle M.D.   On: 11/24/2021 13:49  Stable findings.  We will reimage this in 2 years.  We will be happy to image sooner if she has additional symptoms, but I am hopeful that this will not occur.  This visit was performed via telephone.  Patient location: [place], [place] Provider location: office  I spent a total of 5 minutes non-face-to-face activities for this visit on the date of this encounter including review of current clinical condition and response to treatment.

## 2021-12-16 ENCOUNTER — Other Ambulatory Visit (INDEPENDENT_AMBULATORY_CARE_PROVIDER_SITE_OTHER): Payer: BC Managed Care – PPO

## 2021-12-16 DIAGNOSIS — E876 Hypokalemia: Secondary | ICD-10-CM | POA: Diagnosis not present

## 2021-12-16 LAB — POTASSIUM: Potassium: 3.7 mEq/L (ref 3.5–5.1)

## 2021-12-17 ENCOUNTER — Ambulatory Visit: Payer: BC Managed Care – PPO | Admitting: Internal Medicine

## 2021-12-17 ENCOUNTER — Other Ambulatory Visit: Payer: Self-pay

## 2021-12-17 ENCOUNTER — Telehealth: Payer: Self-pay

## 2021-12-17 DIAGNOSIS — E876 Hypokalemia: Secondary | ICD-10-CM

## 2021-12-17 NOTE — Telephone Encounter (Signed)
LMTCB for lab results.  

## 2021-12-20 ENCOUNTER — Encounter: Payer: Self-pay | Admitting: Internal Medicine

## 2021-12-20 ENCOUNTER — Ambulatory Visit (INDEPENDENT_AMBULATORY_CARE_PROVIDER_SITE_OTHER): Payer: BC Managed Care – PPO | Admitting: Internal Medicine

## 2021-12-20 DIAGNOSIS — K219 Gastro-esophageal reflux disease without esophagitis: Secondary | ICD-10-CM | POA: Diagnosis not present

## 2021-12-20 DIAGNOSIS — E7800 Pure hypercholesterolemia, unspecified: Secondary | ICD-10-CM

## 2021-12-20 DIAGNOSIS — Z860100 Personal history of colon polyps, unspecified: Secondary | ICD-10-CM

## 2021-12-20 DIAGNOSIS — N3010 Interstitial cystitis (chronic) without hematuria: Secondary | ICD-10-CM | POA: Diagnosis not present

## 2021-12-20 DIAGNOSIS — F502 Bulimia nervosa, unspecified: Secondary | ICD-10-CM

## 2021-12-20 DIAGNOSIS — F418 Other specified anxiety disorders: Secondary | ICD-10-CM

## 2021-12-20 DIAGNOSIS — D334 Benign neoplasm of spinal cord: Secondary | ICD-10-CM | POA: Diagnosis not present
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
## 2021-12-26 ENCOUNTER — Encounter: Payer: Self-pay | Admitting: Internal Medicine

## 2021-12-26 NOTE — Assessment & Plan Note (Addendum)
The 10-year ASCVD risk score (Arnett DK, et al., 2019) is: 5%   Values used to calculate the score:     Age: 61 years     Sex: Female     Is Non-Hispanic African American: No     Diabetic: No     Tobacco smoker: No     Systolic Blood Pressure: 355 mmHg     Is BP treated: No     HDL Cholesterol: 41.1 mg/dL     Total Cholesterol: 296 mg/dL  Calcium score 0.

## 2021-12-26 NOTE — Assessment & Plan Note (Signed)
Remains on imipramine and vesicare.  Stable.

## 2021-12-26 NOTE — Assessment & Plan Note (Signed)
Colonoscopy 2021.  Due f/u 5-7 years.

## 2021-12-26 NOTE — Assessment & Plan Note (Signed)
On protonix.  No acid reflux currently.  Follow.

## 2021-12-26 NOTE — Assessment & Plan Note (Signed)
Previously saw [facility] (Dr Izora Ribas).  Had f/u with Dr Izora Ribas - MRI - stable nerve sheath tumor - likely schwannoma.  Recommended f/u in 2 years.

## 2021-12-26 NOTE — Assessment & Plan Note (Signed)
reding - bulimia help method.  Decreased anxiety.  Feels better.  Follow

## 2021-12-26 NOTE — Assessment & Plan Note (Signed)
-

## 2021-12-30 ENCOUNTER — Other Ambulatory Visit (INDEPENDENT_AMBULATORY_CARE_PROVIDER_SITE_OTHER): Payer: BC Managed Care – PPO

## 2021-12-30 DIAGNOSIS — E876 Hypokalemia: Secondary | ICD-10-CM

## 2021-12-30 LAB — POTASSIUM: Potassium: 4 mEq/L (ref 3.5–5.1)

## 2021-12-31 ENCOUNTER — Other Ambulatory Visit: Payer: Self-pay

## 2021-12-31 DIAGNOSIS — E876 Hypokalemia: Secondary | ICD-10-CM

## 2022-01-13 ENCOUNTER — Other Ambulatory Visit (INDEPENDENT_AMBULATORY_CARE_PROVIDER_SITE_OTHER): Payer: BC Managed Care – PPO

## 2022-01-13 DIAGNOSIS — E876 Hypokalemia: Secondary | ICD-10-CM

## 2022-01-13 LAB — POTASSIUM: Potassium: 3.9 mEq/L (ref 3.5–5.1)

## 2022-02-20 ENCOUNTER — Ambulatory Visit
Admission: EM | Admit: 2022-02-20 | Discharge: 2022-02-20 | Disposition: A | Discharge status: Home / Self Care | Payer: BC Managed Care – PPO | Attending: Physician Assistant | Admitting: Physician Assistant

## 2022-02-20 DIAGNOSIS — Z20822 Contact with and (suspected) exposure to covid-19: Secondary | ICD-10-CM | POA: Insufficient documentation

## 2022-02-20 DIAGNOSIS — J069 Acute upper respiratory infection, unspecified: Secondary | ICD-10-CM | POA: Diagnosis present

## 2022-02-20 LAB — SARS CORONAVIRUS 2 BY RT PCR: SARS Coronavirus 2 by RT PCR: NEGATIVE

## 2022-02-20 NOTE — ED Triage Notes (Signed)
Patient with cough and congestion. Husband had 2 positive covid tests at home.

## 2022-02-20 NOTE — Discharge Instructions (Addendum)
-  COVID is negative but if your husband's COVID test is positive, it could mean that it is too soon for you to test positive.  Continue taking at home test each day you are symptomatic.  If you are positive then isolate 5 days from that day and then wear mask x 5 days. - Cough medication as needed for your symptoms, rest and fluids, Tylenol Motrin as needed for any fever or bodyaches/headaches. - You need to be seen again if you have any uncontrolled fever, weakness or shortness of breath.
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
## 2022-02-28 ENCOUNTER — Other Ambulatory Visit: Payer: Self-pay | Admitting: Family

## 2022-03-23 ENCOUNTER — Encounter: Payer: BC Managed Care – PPO | Admitting: Internal Medicine

## 2022-04-04 ENCOUNTER — Encounter: Payer: Self-pay | Admitting: Internal Medicine

## 2022-04-04 ENCOUNTER — Other Ambulatory Visit: Payer: Self-pay

## 2022-04-04 MED ORDER — CITALOPRAM HYDROBROMIDE 10 MG PO TABS: ORAL_TABLET | ORAL | 2 refills | Status: DC

## 2022-04-12 ENCOUNTER — Telehealth: Payer: Self-pay | Admitting: Emergency Medicine

## 2022-04-12 ENCOUNTER — Other Ambulatory Visit: Payer: Self-pay

## 2022-04-12 ENCOUNTER — Encounter: Payer: Self-pay | Admitting: Emergency Medicine

## 2022-04-12 ENCOUNTER — Ambulatory Visit (INDEPENDENT_AMBULATORY_CARE_PROVIDER_SITE_OTHER)

## 2022-04-12 ENCOUNTER — Ambulatory Visit
Admission: EM | Admit: 2022-04-12 | Discharge: 2022-04-12 | Disposition: A | Discharge status: Home / Self Care | Attending: Emergency Medicine | Admitting: Emergency Medicine

## 2022-04-12 DIAGNOSIS — S61212A Laceration without foreign body of right middle finger without damage to nail, initial encounter: Secondary | ICD-10-CM

## 2022-04-12 DIAGNOSIS — S61210A Laceration without foreign body of right index finger without damage to nail, initial encounter: Secondary | ICD-10-CM | POA: Diagnosis not present

## 2022-04-12 DIAGNOSIS — S8255XA Nondisplaced fracture of medial malleolus of left tibia, initial encounter for closed fracture: Secondary | ICD-10-CM

## 2022-04-12 DIAGNOSIS — S61214A Laceration without foreign body of right ring finger without damage to nail, initial encounter: Secondary | ICD-10-CM

## 2022-04-12 MED ORDER — PREDNISONE 20 MG PO TABS: 40.0000 mg | ORAL_TABLET | Freq: Every day | ORAL | 0 refills | Status: DC

## 2022-04-12 MED ORDER — METHYLPREDNISOLONE 4 MG PO TBPK: ORAL_TABLET | ORAL | 0 refills | Status: DC

## 2022-04-12 MED ORDER — CEPHALEXIN 500 MG PO CAPS: 500.0000 mg | ORAL_CAPSULE | Freq: Two times a day (BID) | ORAL | 0 refills | Status: AC

## 2022-04-12 NOTE — ED Notes (Addendum)
Left ankle is swollen, bruising present and reports significant pain
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
## 2022-04-12 NOTE — Discharge Instructions (Addendum)
All lacerations have been cleansed here in the office and closed using skin adhesive, allow this to fall off naturally, do not peel  Again Keflex every morning and every evening for 5 days to prophylactically prevent him from becoming infected  May cleanse daily during normal hygiene using soap and water, pat dry and cover with a nonstick Band-Aid if at risk for becoming dirty  Fracture confirmed on x-ray  You have been placed in a cam boot and given crutches, wear cam boot when completing all activities, may remove when at rest  You may use ice or heat over the affected area in 10 to 15-minute intervals  When sitting and lying you may elevate the leg on 2 pillows to help reduce swelling  You may take Tylenol 500 to 1000 mg every 6 hours for management of pain  Begin prednisone every morning with food for 5 days to help reduce swelling and to help with your pain  Please schedule follow-up visit within 1 week with orthopedic specialist for further evaluation and management

## 2022-04-12 NOTE — ED Notes (Signed)
During provider exam, patient became lightheaded, pale.  Assisted getting patient to stretcher and patient placed in trendelenburg.  Patient recovered quickly.

## 2022-04-12 NOTE — Telephone Encounter (Signed)
Patient no longer like to use prednisone burst as it causes her to have difficulty sleeping at night, willing to attempt use of methylprednisolone as an alternative, cannot take NSAIDs due to her kidney function per her primary doctor, and has allergies to multiple narcotics

## 2022-04-12 NOTE — ED Triage Notes (Addendum)
Patient fell approx 6-7 feet this morning from an attic stairway.  Multiple lacerations/partial avulsions to right hand :middle, ring, finger.  No loc.  Left foot pain, unable to put any pressure on left foot.  Initially felt light headed after fall  Husband was in house, but did not witness fall    Has not had any medications

## 2022-04-21 ENCOUNTER — Encounter: Payer: Self-pay | Admitting: Internal Medicine

## 2022-04-21 DIAGNOSIS — S8253XA Displaced fracture of medial malleolus of unspecified tibia, initial encounter for closed fracture: Secondary | ICD-10-CM | POA: Insufficient documentation

## 2022-05-15 ENCOUNTER — Encounter: Payer: Self-pay | Admitting: Internal Medicine

## 2022-05-16 NOTE — Telephone Encounter (Signed)
Ok to postpone physical, but would like to not postpone too long.

## 2022-05-21 ENCOUNTER — Other Ambulatory Visit: Payer: Self-pay | Admitting: Internal Medicine

## 2022-05-21 ENCOUNTER — Encounter: Payer: Self-pay | Admitting: Internal Medicine

## 2022-05-23 ENCOUNTER — Encounter: Payer: BC Managed Care – PPO | Admitting: Internal Medicine

## 2022-06-16 ENCOUNTER — Encounter: Payer: Self-pay | Admitting: Internal Medicine

## 2022-06-16 ENCOUNTER — Ambulatory Visit (INDEPENDENT_AMBULATORY_CARE_PROVIDER_SITE_OTHER): Payer: BC Managed Care – PPO | Admitting: Internal Medicine

## 2022-06-16 VITALS — BP 120/70 | HR 96 | Temp 97.8°F | Ht 63.5 in | Wt 116.8 lb

## 2022-06-16 DIAGNOSIS — D334 Benign neoplasm of spinal cord: Secondary | ICD-10-CM

## 2022-06-16 DIAGNOSIS — Z8249 Family history of ischemic heart disease and other diseases of the circulatory system: Secondary | ICD-10-CM

## 2022-06-16 DIAGNOSIS — E7800 Pure hypercholesterolemia, unspecified: Secondary | ICD-10-CM | POA: Diagnosis not present

## 2022-06-16 DIAGNOSIS — N3010 Interstitial cystitis (chronic) without hematuria: Secondary | ICD-10-CM | POA: Diagnosis not present

## 2022-06-16 DIAGNOSIS — S8255XD Nondisplaced fracture of medial malleolus of left tibia, subsequent encounter for closed fracture with routine healing: Secondary | ICD-10-CM

## 2022-06-16 DIAGNOSIS — K219 Gastro-esophageal reflux disease without esophagitis: Secondary | ICD-10-CM

## 2022-06-16 DIAGNOSIS — K209 Esophagitis, unspecified without bleeding: Secondary | ICD-10-CM

## 2022-06-16 DIAGNOSIS — E559 Vitamin D deficiency, unspecified: Secondary | ICD-10-CM

## 2022-06-16 DIAGNOSIS — Z1231 Encounter for screening mammogram for malignant neoplasm of breast: Secondary | ICD-10-CM | POA: Diagnosis not present

## 2022-06-16 DIAGNOSIS — Z0000 Encounter for general adult medical examination without abnormal findings: Secondary | ICD-10-CM

## 2022-06-16 DIAGNOSIS — F502 Bulimia nervosa: Secondary | ICD-10-CM

## 2022-06-16 DIAGNOSIS — R82998 Other abnormal findings in urine: Secondary | ICD-10-CM

## 2022-06-16 MED ORDER — CITALOPRAM HYDROBROMIDE 10 MG PO TABS: ORAL_TABLET | ORAL | 2 refills | Status: DC

## 2022-06-16 NOTE — Patient Instructions (Signed)
Benefiber - daily 

## 2022-06-16 NOTE — Assessment & Plan Note (Addendum)
Physical today 06/16/22.  PAP 02/16/21 - negative with negative HPV. Mammogram 05/01/21 - Birads I.  Colonoscopy 05/2017.  Recommended f/u in 3 years.  F/u colonoscopy 2021. Due f/u 5-7 years.
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
## 2022-06-17 ENCOUNTER — Encounter: Payer: Self-pay | Admitting: Internal Medicine

## 2022-06-17 LAB — URINALYSIS, ROUTINE W REFLEX MICROSCOPIC
Hgb urine dipstick: NEGATIVE
Ketones, ur: NEGATIVE
Leukocytes,Ua: NEGATIVE
Nitrite: NEGATIVE
Specific Gravity, Urine: 1.01 (ref 1.000–1.030)
Total Protein, Urine: NEGATIVE
Urine Glucose: NEGATIVE
Urobilinogen, UA: 1 (ref 0.0–1.0)
pH: 7 (ref 5.0–8.0)

## 2022-06-19 ENCOUNTER — Encounter: Payer: Self-pay | Admitting: Internal Medicine

## 2022-06-19 NOTE — Assessment & Plan Note (Signed)
No upper symptoms reported.  Has been evaluated by GI.  On protonix.

## 2022-06-19 NOTE — Assessment & Plan Note (Signed)
On protonix.  No acid reflux currently.  Follow.  

## 2022-06-19 NOTE — Assessment & Plan Note (Signed)
The 10-year ASCVD risk score (Arnett DK, et al., 2019) is: 4.6%   Values used to calculate the score:     Age: 62 years     Sex: Female     Is Non-Hispanic African American: No     Diabetic: No     Tobacco smoker: No     Systolic Blood Pressure: 105 mmHg     Is BP treated: No     HDL Cholesterol: 41.1 mg/dL     Total Cholesterol: 296 mg/dL  Calcium score 0. Continue diet and exercise.  Follow lipid panel.

## 2022-06-19 NOTE — Assessment & Plan Note (Signed)
Previously saw [facility] (Dr Yarbrough).  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  Recommended f/u in 2 years.  

## 2022-06-19 NOTE — Assessment & Plan Note (Signed)
Last urine - no red blood cells.  Evaluated kidneys on abdominal ultrasound.   °

## 2022-06-19 NOTE — Assessment & Plan Note (Signed)
Has been adjusted diet, etc.  Given recent issues, planning to start again.  Overall appears to be doing better. Seems to feel better.  Follow

## 2022-06-19 NOTE — Assessment & Plan Note (Signed)
Remains on imipramine and vesicare.  Request urine check today.

## 2022-06-19 NOTE — Assessment & Plan Note (Signed)
Recent calcium score - 0.

## 2022-06-19 NOTE — Assessment & Plan Note (Signed)
Left - Emerge - cast (03/2022).  Follow up 05/2022 - transition to walking boot. PT

## 2022-06-19 NOTE — Assessment & Plan Note (Signed)
Follow vitamin D level. Recheck with next labs.

## 2022-06-20 ENCOUNTER — Encounter: Payer: Self-pay | Admitting: Internal Medicine

## 2022-06-20 DIAGNOSIS — R300 Dysuria: Secondary | ICD-10-CM

## 2022-06-20 NOTE — Telephone Encounter (Signed)
I can see her if having new symptoms and wants evaluation prior to trip.  If declining evaluation,  need another urine - will check for urinalysis and culture.

## 2022-06-20 NOTE — Telephone Encounter (Signed)
Per review, she is scheduled to have lab tomorrow.  We should have some information about her urine prior to Friday.  Have her call Thursday if she has not heard from [place].  Regarding her bloating - she did not mention this as being a problem at her appt.  Monitor food - avoiding foods that aggravate.  Keep bowels moving - benefiber daily.  If increased gas - gas x.  If persistent or any acute problems, she will need to be seen.

## 2022-06-20 NOTE — Telephone Encounter (Signed)
Patient aware of below. Urine orders placed. Will let me know if wants to be evaluated or bloating persists.

## 2022-06-20 NOTE — Telephone Encounter (Signed)
See other my chart message sent to Dr Lorin Picket

## 2022-06-20 NOTE — Telephone Encounter (Signed)
Spoke with patient. She says that she was just here for an appointment- did not feel like re-evaluation was necessary but was agreeable to move up lab appointment so we can collect another urine and send for culture. She leaves to go on an 11 day cruise Friday at 4 am. Was concerned about not having abx on hand in case she needs them while she is gone. She also says that the bloating is her biggest concern because she is having trouble fitting in her pants. Reports no bowel changes or issues. She is wondering if there is something OTC she can use for this. I will place order for urine and culture.

## 2022-06-21 ENCOUNTER — Other Ambulatory Visit (INDEPENDENT_AMBULATORY_CARE_PROVIDER_SITE_OTHER): Payer: BC Managed Care – PPO

## 2022-06-21 DIAGNOSIS — E559 Vitamin D deficiency, unspecified: Secondary | ICD-10-CM

## 2022-06-21 DIAGNOSIS — R300 Dysuria: Secondary | ICD-10-CM | POA: Diagnosis not present

## 2022-06-21 DIAGNOSIS — E7800 Pure hypercholesterolemia, unspecified: Secondary | ICD-10-CM

## 2022-06-21 DIAGNOSIS — Z0000 Encounter for general adult medical examination without abnormal findings: Secondary | ICD-10-CM | POA: Diagnosis not present

## 2022-06-21 LAB — HEPATIC FUNCTION PANEL
ALT: 14 U/L (ref 0–35)
AST: 22 U/L (ref 0–37)
Albumin: 4 g/dL (ref 3.5–5.2)
Alkaline Phosphatase: 92 U/L (ref 39–117)
Bilirubin, Direct: 0.1 mg/dL (ref 0.0–0.3)
Total Bilirubin: 0.4 mg/dL (ref 0.2–1.2)
Total Protein: 6.3 g/dL (ref 6.0–8.3)

## 2022-06-21 LAB — LIPID PANEL
Cholesterol: 298 mg/dL — ABNORMAL HIGH (ref 0–200)
HDL: 42.2 mg/dL (ref >39.00)
NonHDL: 255.45
Total CHOL/HDL Ratio: 7
Triglycerides: 243 mg/dL — ABNORMAL HIGH (ref 0.0–149.0)
VLDL: 48.6 mg/dL — ABNORMAL HIGH (ref 0.0–40.0)

## 2022-06-21 LAB — URINALYSIS, ROUTINE W REFLEX MICROSCOPIC
Bilirubin Urine: NEGATIVE
Hgb urine dipstick: NEGATIVE
Ketones, ur: NEGATIVE
Leukocytes,Ua: NEGATIVE
Nitrite: NEGATIVE
Specific Gravity, Urine: 1.015 (ref 1.000–1.030)
Total Protein, Urine: NEGATIVE
Urine Glucose: NEGATIVE
Urobilinogen, UA: 0.2 (ref 0.0–1.0)
pH: 7 (ref 5.0–8.0)

## 2022-06-21 LAB — LDL CHOLESTEROL, DIRECT: Direct LDL: 184 mg/dL

## 2022-06-21 LAB — BASIC METABOLIC PANEL
BUN: 13 mg/dL (ref 6–23)
CO2: 33 mEq/L — ABNORMAL HIGH (ref 19–32)
Calcium: 9 mg/dL (ref 8.4–10.5)
Chloride: 101 mEq/L (ref 96–112)
Creatinine, Ser: 1.02 mg/dL (ref 0.40–1.20)
GFR: 58.42 mL/min — ABNORMAL LOW (ref >60.00)
Glucose, Bld: 82 mg/dL (ref 70–99)
Potassium: 3.6 mEq/L (ref 3.5–5.1)
Sodium: 141 mEq/L (ref 135–145)

## 2022-06-21 LAB — VITAMIN D 25 HYDROXY (VIT D DEFICIENCY, FRACTURES): VITD: 33.21 ng/mL (ref 30.00–100.00)

## 2022-06-22 ENCOUNTER — Other Ambulatory Visit

## 2022-06-22 LAB — URINE CULTURE
MICRO NUMBER:: 14862153
Result:: NO GROWTH
SPECIMEN QUALITY:: ADEQUATE

## 2022-07-02 ENCOUNTER — Other Ambulatory Visit: Payer: Self-pay | Admitting: Internal Medicine

## 2022-07-07 ENCOUNTER — Ambulatory Visit
Admission: EM | Admit: 2022-07-07 | Discharge: 2022-07-07 | Disposition: A | Discharge status: Home / Self Care | Attending: Emergency Medicine | Admitting: Emergency Medicine

## 2022-07-07 DIAGNOSIS — J069 Acute upper respiratory infection, unspecified: Secondary | ICD-10-CM

## 2022-07-07 MED ORDER — IPRATROPIUM BROMIDE 0.06 % NA SOLN: 2.0000 | Freq: Four times a day (QID) | NASAL | 12 refills | Status: DC

## 2022-07-07 MED ORDER — PROMETHAZINE-DM 6.25-15 MG/5ML PO SYRP: 5.0000 mL | ORAL_SOLUTION | Freq: Four times a day (QID) | ORAL | 0 refills | Status: DC | PRN

## 2022-07-07 MED ORDER — BENZONATATE 100 MG PO CAPS: 200.0000 mg | ORAL_CAPSULE | Freq: Three times a day (TID) | ORAL | 0 refills | Status: DC

## 2022-07-07 NOTE — Discharge Instructions (Signed)
Your exam is consistent with a viral respiratory infection and I do not think you need an antibiotic at this time. The following medications should help your symptoms.  Use the Atrovent nasal spray, 2 squirts in each nostril every 6 hours, as needed for runny nose and postnasal drip.  Use the Tessalon Perles every 8 hours during the day.  Take them with a small sip of water.  They may give you some numbness to the base of your tongue or a metallic taste in your mouth, this is normal.  Use the Promethazine DM cough syrup at bedtime for cough and congestion.  It will make you drowsy so do not take it during the day.  Return for reevaluation or see your primary care provider for any new or worsening symptoms.

## 2022-07-07 NOTE — ED Triage Notes (Addendum)
Pt c/o bodyaches,fatigue,cough,sore throat & R ear pain x3 days. States she recently came back from [place] cruise. Has tried natural remedies & cough syrup w/o relief. Tested neg for covid at home Tuesday.
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
## 2022-07-27 ENCOUNTER — Ambulatory Visit
Admission: RE | Admit: 2022-07-27 | Discharge: 2022-07-27 | Disposition: A | Discharge status: Home / Self Care | Payer: BC Managed Care – PPO | Source: Ambulatory Visit | Attending: Internal Medicine | Admitting: Internal Medicine

## 2022-07-27 DIAGNOSIS — Z1231 Encounter for screening mammogram for malignant neoplasm of breast: Secondary | ICD-10-CM | POA: Insufficient documentation

## 2022-07-28 ENCOUNTER — Other Ambulatory Visit: Payer: Self-pay | Admitting: Internal Medicine

## 2022-07-28 DIAGNOSIS — R928 Other abnormal and inconclusive findings on diagnostic imaging of breast: Secondary | ICD-10-CM

## 2022-07-28 DIAGNOSIS — N6489 Other specified disorders of breast: Secondary | ICD-10-CM

## 2022-07-29 ENCOUNTER — Encounter: Payer: Self-pay | Admitting: Internal Medicine

## 2022-07-29 ENCOUNTER — Telehealth: Payer: Self-pay

## 2022-07-29 NOTE — Telephone Encounter (Signed)
-----   Message from Dale Oxford, MD sent at 07/29/2022  4:03 AM EDT ----- Notify - I reviewed her recent mammogram report and radiology is recommending a f/u left breast mammogram and possible ultrasound (to better evaluate the left breast).  The order has been placed.  Someone should be contacting her with an appt date and time.  Let us know if not scheduled.

## 2022-08-03 ENCOUNTER — Ambulatory Visit
Admission: RE | Admit: 2022-08-03 | Discharge: 2022-08-03 | Disposition: A | Discharge status: Home / Self Care | Source: Ambulatory Visit | Attending: Internal Medicine | Admitting: Internal Medicine

## 2022-08-03 DIAGNOSIS — N6489 Other specified disorders of breast: Secondary | ICD-10-CM

## 2022-08-03 DIAGNOSIS — R928 Other abnormal and inconclusive findings on diagnostic imaging of breast: Secondary | ICD-10-CM

## 2022-10-13 ENCOUNTER — Encounter (INDEPENDENT_AMBULATORY_CARE_PROVIDER_SITE_OTHER): Payer: Self-pay

## 2022-10-24 ENCOUNTER — Other Ambulatory Visit: Payer: Self-pay | Admitting: Internal Medicine

## 2022-10-24 MED ORDER — LEVOTHYROXINE SODIUM 50 MCG PO TABS: ORAL_TABLET | ORAL | 1 refills | Status: DC

## 2022-11-01 ENCOUNTER — Ambulatory Visit (INDEPENDENT_AMBULATORY_CARE_PROVIDER_SITE_OTHER): Payer: BC Managed Care – PPO | Admitting: Internal Medicine

## 2022-11-01 ENCOUNTER — Encounter: Payer: Self-pay | Admitting: Internal Medicine

## 2022-11-01 VITALS — BP 118/70 | HR 95 | Temp 97.8°F | Ht 63.0 in | Wt 117.8 lb

## 2022-11-01 DIAGNOSIS — K219 Gastro-esophageal reflux disease without esophagitis: Secondary | ICD-10-CM

## 2022-11-01 DIAGNOSIS — Z86010 Personal history of colonic polyps: Secondary | ICD-10-CM

## 2022-11-01 DIAGNOSIS — E7800 Pure hypercholesterolemia, unspecified: Secondary | ICD-10-CM | POA: Diagnosis not present

## 2022-11-01 DIAGNOSIS — R0989 Other specified symptoms and signs involving the circulatory and respiratory systems: Secondary | ICD-10-CM

## 2022-11-01 DIAGNOSIS — F502 Bulimia nervosa: Secondary | ICD-10-CM | POA: Diagnosis not present

## 2022-11-01 DIAGNOSIS — E559 Vitamin D deficiency, unspecified: Secondary | ICD-10-CM | POA: Diagnosis not present

## 2022-11-01 DIAGNOSIS — D649 Anemia, unspecified: Secondary | ICD-10-CM

## 2022-11-01 DIAGNOSIS — K209 Esophagitis, unspecified without bleeding: Secondary | ICD-10-CM | POA: Diagnosis not present

## 2022-11-01 DIAGNOSIS — D334 Benign neoplasm of spinal cord: Secondary | ICD-10-CM

## 2022-11-01 NOTE — Assessment & Plan Note (Signed)
Previous ultrasound - no aneurysm.

## 2022-11-01 NOTE — Assessment & Plan Note (Signed)
Check vitamin D level with next labs.  ?
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
## 2022-11-01 NOTE — Assessment & Plan Note (Addendum)
Discussed with her today.  Discussed f/u with counselor.  She plans to call.  Has f/u with GI next week.  Add pepcid to medication regimen.  Discussed seeing an nutritionist to help with diet.

## 2022-11-01 NOTE — Assessment & Plan Note (Signed)
Acid reflux despite protonix.  Add pepcid before evening meal. Keep f/u with GI next week.

## 2022-11-01 NOTE — Assessment & Plan Note (Signed)
Follow cbc.  

## 2022-11-01 NOTE — Assessment & Plan Note (Signed)
Colonoscopy 2021.  Due f/u 5-7 years.  

## 2022-11-01 NOTE — Assessment & Plan Note (Signed)
Previously saw NSU (Dr Myer Haff).  Had f/u with Dr Myer Haff - MRI - stable nerve sheath tumor - likely schwannoma.  Recommended f/u in 2 years. Last seen 11/2021.

## 2022-11-01 NOTE — Assessment & Plan Note (Signed)
The 10-year ASCVD risk score (Arnett DK, et al., 2019) is: 6.1%   Values used to calculate the score:     Age: 62 years     Sex: Female     Is Non-Hispanic African American: No     Diabetic: No     Tobacco smoker: No     Systolic Blood Pressure: 118 mmHg     Is BP treated: No     HDL Cholesterol: 42.2 mg/dL     Total Cholesterol: 298 mg/dL  Calcium score 0. Continue diet and exercise.  Follow lipid panel.

## 2022-11-01 NOTE — Patient Instructions (Signed)
Pepcid (famotidine) 20mg - take one tablet 30 minutes before your evening meal.   

## 2022-11-21 ENCOUNTER — Ambulatory Visit
Admission: EM | Admit: 2022-11-21 | Discharge: 2022-11-21 | Disposition: A | Discharge status: Home / Self Care | Payer: BC Managed Care – PPO | Attending: Emergency Medicine | Admitting: Emergency Medicine

## 2022-11-21 DIAGNOSIS — J028 Acute pharyngitis due to other specified organisms: Secondary | ICD-10-CM

## 2022-11-21 LAB — GROUP A STREP BY PCR: Group A Strep by PCR: NOT DETECTED

## 2022-11-21 MED ORDER — AMOXICILLIN 500 MG PO CAPS: 500.0000 mg | ORAL_CAPSULE | Freq: Three times a day (TID) | ORAL | 0 refills | Status: DC

## 2022-11-21 MED ORDER — LIDOCAINE VISCOUS HCL 2 % MT SOLN: 15.0000 mL | OROMUCOSAL | 0 refills | Status: DC | PRN

## 2022-11-21 NOTE — Discharge Instructions (Addendum)
Start Amoxicillin 3 times daily. May use Lidocaine to gargle for sore throat. Strep was negative but likely an acute pharyngitis.   Return to urgent care if symptoms worsen or fail to resolve.
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
## 2022-11-21 NOTE — ED Triage Notes (Signed)
Patient presents with mucus in bilateral eyes, sore throat and pain in right ear x day 3. Treated with salt water and Advil.

## 2022-11-24 ENCOUNTER — Other Ambulatory Visit (INDEPENDENT_AMBULATORY_CARE_PROVIDER_SITE_OTHER): Payer: BC Managed Care – PPO

## 2022-11-24 DIAGNOSIS — E7800 Pure hypercholesterolemia, unspecified: Secondary | ICD-10-CM | POA: Diagnosis not present

## 2022-11-24 DIAGNOSIS — E559 Vitamin D deficiency, unspecified: Secondary | ICD-10-CM | POA: Diagnosis not present

## 2022-11-24 LAB — CBC WITH DIFFERENTIAL/PLATELET
Basophils Absolute: 0 10*3/uL (ref 0.0–0.1)
Basophils Relative: 0.1 % (ref 0.0–3.0)
Eosinophils Absolute: 0 10*3/uL (ref 0.0–0.7)
Eosinophils Relative: 0.6 % (ref 0.0–5.0)
HCT: 40.4 % (ref 36.0–46.0)
Hemoglobin: 13.4 g/dL (ref 12.0–15.0)
Lymphocytes Relative: 25.1 % (ref 12.0–46.0)
Lymphs Abs: 1.6 10*3/uL (ref 0.7–4.0)
MCHC: 33.2 g/dL (ref 30.0–36.0)
MCV: 91.2 fl (ref 78.0–100.0)
Monocytes Absolute: 0.5 10*3/uL (ref 0.1–1.0)
Monocytes Relative: 7.9 % (ref 3.0–12.0)
Neutro Abs: 4.3 10*3/uL (ref 1.4–7.7)
Neutrophils Relative %: 66.3 % (ref 43.0–77.0)
Platelets: 227 10*3/uL (ref 150.0–400.0)
RBC: 4.43 Mil/uL (ref 3.87–5.11)
RDW: 13.9 % (ref 11.5–15.5)
WBC: 6.4 10*3/uL (ref 4.0–10.5)

## 2022-11-24 LAB — LIPID PANEL
Cholesterol: 292 mg/dL — ABNORMAL HIGH (ref 0–200)
HDL: 42.8 mg/dL (ref >39.00)
LDL Cholesterol: 200 mg/dL — ABNORMAL HIGH (ref 0–99)
NonHDL: 248.81
Total CHOL/HDL Ratio: 7
Triglycerides: 245 mg/dL — ABNORMAL HIGH (ref 0.0–149.0)
VLDL: 49 mg/dL — ABNORMAL HIGH (ref 0.0–40.0)

## 2022-11-24 LAB — BASIC METABOLIC PANEL
BUN: 20 mg/dL (ref 6–23)
CO2: 31 mEq/L (ref 19–32)
Calcium: 9.7 mg/dL (ref 8.4–10.5)
Chloride: 103 mEq/L (ref 96–112)
Creatinine, Ser: 1.06 mg/dL (ref 0.40–1.20)
GFR: 55.62 mL/min — ABNORMAL LOW (ref >60.00)
Glucose, Bld: 92 mg/dL (ref 70–99)
Potassium: 4 mEq/L (ref 3.5–5.1)
Sodium: 141 mEq/L (ref 135–145)

## 2022-11-24 LAB — HEPATIC FUNCTION PANEL
ALT: 15 U/L (ref 0–35)
AST: 21 U/L (ref 0–37)
Albumin: 4.5 g/dL (ref 3.5–5.2)
Alkaline Phosphatase: 74 U/L (ref 39–117)
Bilirubin, Direct: 0 mg/dL (ref 0.0–0.3)
Total Bilirubin: 0.6 mg/dL (ref 0.2–1.2)
Total Protein: 7.4 g/dL (ref 6.0–8.3)

## 2022-11-24 LAB — VITAMIN D 25 HYDROXY (VIT D DEFICIENCY, FRACTURES): VITD: 35.91 ng/mL (ref 30.00–100.00)

## 2022-11-24 LAB — TSH: TSH: 1.31 u[IU]/mL (ref 0.35–5.50)

## 2022-11-29 ENCOUNTER — Telehealth: Payer: Self-pay

## 2022-11-29 NOTE — Telephone Encounter (Signed)
-----   Message from [facility] sent at 11/28/2022  9:08 PM EDT ----- When LDL is greater than 190, it is recommended to start a cholesterol medication.  Usually a genetic component.  If declines, will continue to follow.  If agreeable, can start zetia 10mg  q day.

## 2022-11-30 NOTE — Telephone Encounter (Signed)
Patient states she is returning call from Rita Ohara, LPN.  I read Dr. Westley Hummer Scott's message to patient.  Patient states she is agreeable to starting zetia 10mg  q day.  Patient states her preferred pharmacy is [facility] in [place].

## 2022-12-01 ENCOUNTER — Encounter: Payer: Self-pay | Admitting: Internal Medicine

## 2022-12-01 MED ORDER — EZETIMIBE 10 MG PO TABS: 10.0000 mg | ORAL_TABLET | Freq: Every day | ORAL | 5 refills | Status: DC

## 2022-12-01 NOTE — Addendum Note (Signed)
Addended by: Rita Ohara D on: 12/01/2022 07:31 AM   Modules accepted: Orders

## 2022-12-01 NOTE — Telephone Encounter (Signed)
Rx sent in

## 2022-12-09 ENCOUNTER — Ambulatory Visit: Payer: BC Managed Care – PPO

## 2022-12-09 DIAGNOSIS — K219 Gastro-esophageal reflux disease without esophagitis: Secondary | ICD-10-CM | POA: Diagnosis present

## 2022-12-09 DIAGNOSIS — K449 Diaphragmatic hernia without obstruction or gangrene: Secondary | ICD-10-CM | POA: Diagnosis not present

## 2022-12-13 ENCOUNTER — Other Ambulatory Visit: Payer: Self-pay

## 2022-12-13 ENCOUNTER — Other Ambulatory Visit: Payer: Self-pay | Admitting: Internal Medicine

## 2022-12-13 ENCOUNTER — Encounter: Payer: Self-pay | Admitting: Internal Medicine

## 2022-12-13 MED ORDER — CITALOPRAM HYDROBROMIDE 10 MG PO TABS: ORAL_TABLET | ORAL | 2 refills | Status: DC

## 2023-03-10 ENCOUNTER — Ambulatory Visit (INDEPENDENT_AMBULATORY_CARE_PROVIDER_SITE_OTHER): Admitting: Internal Medicine

## 2023-03-10 ENCOUNTER — Encounter: Payer: Self-pay | Admitting: Internal Medicine

## 2023-03-10 VITALS — BP 118/70 | HR 87 | Temp 98.0°F | Resp 16 | Ht 63.0 in | Wt 120.0 lb

## 2023-03-10 DIAGNOSIS — D649 Anemia, unspecified: Secondary | ICD-10-CM

## 2023-03-10 DIAGNOSIS — E559 Vitamin D deficiency, unspecified: Secondary | ICD-10-CM | POA: Diagnosis not present

## 2023-03-10 DIAGNOSIS — E7800 Pure hypercholesterolemia, unspecified: Secondary | ICD-10-CM | POA: Diagnosis not present

## 2023-03-10 DIAGNOSIS — D334 Benign neoplasm of spinal cord: Secondary | ICD-10-CM

## 2023-03-10 DIAGNOSIS — K219 Gastro-esophageal reflux disease without esophagitis: Secondary | ICD-10-CM

## 2023-03-10 DIAGNOSIS — F502 Bulimia nervosa, unspecified: Secondary | ICD-10-CM

## 2023-03-10 LAB — LIPID PANEL
Cholesterol: 290 mg/dL — ABNORMAL HIGH (ref 0–200)
HDL: 41.5 mg/dL (ref >39.00)
LDL Cholesterol: 189 mg/dL — ABNORMAL HIGH (ref 0–99)
NonHDL: 248
Total CHOL/HDL Ratio: 7
Triglycerides: 296 mg/dL — ABNORMAL HIGH (ref 0.0–149.0)
VLDL: 59.2 mg/dL — ABNORMAL HIGH (ref 0.0–40.0)

## 2023-03-10 LAB — BASIC METABOLIC PANEL
BUN: 15 mg/dL (ref 6–23)
CO2: 31 meq/L (ref 19–32)
Calcium: 9.4 mg/dL (ref 8.4–10.5)
Chloride: 102 meq/L (ref 96–112)
Creatinine, Ser: 1.11 mg/dL (ref 0.40–1.20)
GFR: 52.52 mL/min — ABNORMAL LOW (ref >60.00)
Glucose, Bld: 90 mg/dL (ref 70–99)
Potassium: 3.5 meq/L (ref 3.5–5.1)
Sodium: 141 meq/L (ref 135–145)

## 2023-03-10 LAB — HEPATIC FUNCTION PANEL
ALT: 16 U/L (ref 0–35)
AST: 22 U/L (ref 0–37)
Albumin: 4.4 g/dL (ref 3.5–5.2)
Alkaline Phosphatase: 91 U/L (ref 39–117)
Bilirubin, Direct: 0.1 mg/dL (ref 0.0–0.3)
Total Bilirubin: 0.5 mg/dL (ref 0.2–1.2)
Total Protein: 7 g/dL (ref 6.0–8.3)
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
## 2023-03-13 ENCOUNTER — Encounter: Payer: Self-pay | Admitting: Internal Medicine

## 2023-03-13 NOTE — Assessment & Plan Note (Signed)
Previously saw NSU (Dr Myer Haff).  Had f/u with Dr Myer Haff - MRI - stable nerve sheath tumor - likely schwannoma.  Recommended f/u in 2 years. Last seen 11/2021.

## 2023-03-13 NOTE — Assessment & Plan Note (Signed)
 Follow cbc.

## 2023-03-13 NOTE — Assessment & Plan Note (Addendum)
 Saw GI 11/09/22 - persistent issues with GERD. Protonix bid and EGD recommended. Need official report - reports everything ok. No increased upper symptoms reported.

## 2023-03-13 NOTE — Assessment & Plan Note (Signed)
 The 10-year ASCVD risk score (Arnett DK, et al., 2019) is: 6.1%   Values used to calculate the score:     Age: 63 years     Sex: Female     Is Non-Hispanic African American: No     Diabetic: No     Tobacco smoker: No     Systolic Blood Pressure: 118 mmHg     Is BP treated: No     HDL Cholesterol: 41.5 mg/dL     Total Cholesterol: 290 mg/dL  Calcium  score 0. Continue diet and exercise.  Follow lipid panel.

## 2023-03-13 NOTE — Assessment & Plan Note (Signed)
Vitamin D level wnl 10/2022.

## 2023-03-13 NOTE — Assessment & Plan Note (Addendum)
 Was referred to nutritionist last visit.  Seeing her counselor. Saw GI 11/09/22 - persistent issues with GERD. Protonix  bid and EGD recommended. Need official report - reports everything ok.  She has done well with adjusting her diet. Was eating more protein, etc. Is staying active. Feels good. No bloating now. No abdominal pain.  Bowels moving. Her insurance has changed. Request new referral for nutritionist.

## 2023-03-20 ENCOUNTER — Other Ambulatory Visit: Payer: Self-pay

## 2023-03-20 DIAGNOSIS — R944 Abnormal results of kidney function studies: Secondary | ICD-10-CM

## 2023-03-20 NOTE — Progress Notes (Signed)
BMP ordered

## 2023-03-24 ENCOUNTER — Other Ambulatory Visit: Payer: Self-pay | Admitting: Internal Medicine

## 2023-04-17 ENCOUNTER — Other Ambulatory Visit (INDEPENDENT_AMBULATORY_CARE_PROVIDER_SITE_OTHER): Payer: 59

## 2023-04-17 ENCOUNTER — Encounter: Payer: Self-pay | Admitting: Internal Medicine

## 2023-04-17 DIAGNOSIS — R944 Abnormal results of kidney function studies: Secondary | ICD-10-CM

## 2023-04-17 LAB — BASIC METABOLIC PANEL WITH GFR
BUN: 14 mg/dL (ref 6–23)
CO2: 30 meq/L (ref 19–32)
Calcium: 9 mg/dL (ref 8.4–10.5)
Chloride: 101 meq/L (ref 96–112)
Creatinine, Ser: 1.06 mg/dL (ref 0.40–1.20)
GFR: 55.46 mL/min — ABNORMAL LOW
Glucose, Bld: 93 mg/dL (ref 70–99)
Potassium: 3.5 meq/L (ref 3.5–5.1)
Sodium: 140 meq/L (ref 135–145)

## 2023-04-18 ENCOUNTER — Other Ambulatory Visit

## 2023-04-25 ENCOUNTER — Encounter: Payer: Self-pay | Admitting: Registered"

## 2023-04-25 ENCOUNTER — Encounter: Attending: Internal Medicine | Admitting: Registered"

## 2023-04-25 DIAGNOSIS — F502 Bulimia nervosa, unspecified: Secondary | ICD-10-CM | POA: Diagnosis present

## 2023-04-25 NOTE — Patient Instructions (Signed)
-   Aim to have balanced afternoon snack to include carbohydrates + protein such as: Fruit + nuts Fruit + peanut butter  - Increase water intake to 2 bottles a day.  1 bottle with lunch 1 bottle with dinner

## 2023-04-25 NOTE — Progress Notes (Unsigned)
 Appointment start time: 10:16  Appointment end time: 11:09  Patient was seen on 04/25/2023 for nutrition counseling pertaining to disordered eating  Primary care provider: Dale Avinger, MD Therapist:   ROI:  Any other medical team members:    Assessment  Pt arrives stating she has fought bulimia off and on since age 63.  States she has learned to decrease sugar intake to help reduce sugar cravings until she gets around regular food and people eating normally and then she "falls off".   Reports she has a lot of high cholesterol in her family: both parents.   States she is trying to keep away from sugar. States when she is low carb, eats more protein, she has more energy. States she wants to not drink sodas. Reports she made homemade chicken salad and had with a sandwich yesterday which was filling. States in the evening her body wants to eat more than it needs. States she knows its more than she needs because its hard for her food to stay down.    Eating history: Length of time: since age 18 Previous treatments:  Goals for RD meetings:   Weight history:  Highest weight:    Lowest weight:  Most consistent weight:   What would you like to weigh: How has weight changed in the past year:   Medical Information:  Changes in hair, skin, nails since ED started:  Chewing/swallowing difficulties:  Reflux or heartburn:  Trouble with teeth:  LMP without the use of hormones:   Weight at that point:  Effect of exercise on menses:    Effect of hormones on menses:  Constipation, diarrhea:  Dizziness/lightheadedness:  Headaches/body aches:  Heart racing/chest pain:  Mood:  Sleep:  Focus/concentration:  Cold intolerance:  Vision changes:   Mental health diagnosis: bulimia nervosa   Dietary assessment: A typical day consists of 3 meals and 1 snacks  Safe foods include:   Avoided foods include:  24 hour recall:  B (7 am): [place]-sausage biscuit + soda S L (10:30 am):  chicken salad sandwich  S D (5-7 pm): chicken alfredo (small bowl) + chicken salad sandwich on sourdough bread; feels like this was too much and purged  S (10 pm): 1/2 chicken salad sandwich; felt like she was starving  Beverages: soda (16-21 oz), water (1-1.5*16.9 oz; 16.9-25.4 oz); 46 oz  What Methods Do You Use To Control Your Weight (Compensatory behaviors)?           Restricting (calories, fat, carbs)  SIV  Diet pills  Laxatives  Diuretics  Alcohol or drugs  Exercise (what type)  Food rules or rituals (explain)  Binge  Estimated energy intake: 2300-2400 kcal  Estimated energy needs: 1600-1800 kcal 180-200 g CHO 120-135 g pro 44-50 g fat  Nutrition Diagnosis: NB-1.5 Disordered eating pattern As related to bulimia nervosa.  As evidenced by purging.  Intervention/Goals: Nutrition education and counseling. Pt was educated on the benefits of eating a variety of food groups at each meal. Discussed the purpose of each food group and ways to create balance with already established regimen. Discussed eating every 3-5 hours to help adequately nourish body and ways to increase water intake. Pt agreed with goals listed.  Goals: - Aim to have balanced afternoon snack to include carbohydrates + protein such as: Fruit + nuts Fruit + peanut butter - Increase water intake to 2 bottles a day.  1 bottle with lunch 1 bottle with dinner  Meal plan:    3 meals  1-2 snacks  Monitoring and Evaluation: Patient will follow up in 2 weeks.

## 2023-05-08 ENCOUNTER — Other Ambulatory Visit: Payer: Self-pay | Admitting: Internal Medicine

## 2023-05-10 ENCOUNTER — Encounter: Attending: Internal Medicine | Admitting: Registered"

## 2023-05-10 ENCOUNTER — Encounter: Payer: Self-pay | Admitting: Registered"

## 2023-05-10 DIAGNOSIS — F502 Bulimia nervosa, unspecified: Secondary | ICD-10-CM | POA: Insufficient documentation

## 2023-05-10 NOTE — Patient Instructions (Addendum)
-   Aim to have balanced meals even when eating out. Example includes: Chickfil[place]-2 chicken strips + side salad + fries/fruit cup  - Aim to have balanced afternoon snack to include carbohydrates + protein such as: Fruit + nuts Fruit + peanut butter  - Continue to have at least 2 bottles of water a day.

## 2023-05-10 NOTE — Progress Notes (Signed)
 This visit was completed virtually. I spoke with Linda Perez and verified that I was speaking with the correct person with two patient identifiers (full name and date of birth). I discussed the limitations related to this kind of visit and the patient is willing to proceed.   Appointment start time: 10:30  Appointment end time: 11:10  Patient was seen on 05/10/2023 for nutrition counseling pertaining to disordered eating  Primary care provider: Dale Tippecanoe, MD Therapist: none  ROI: N/A Any other medical team members: none reported   Assessment  Pt states she has been working hard on improving eating and relationship with food since previous visit. States she has been more mindful of consuming protein items; eating more cashew butter instead of candy. States she is less hungry throughout the day because she is eating more adequately and thinking less about food on a daily basis. States she is having less guilty feelings related to drinking soda. States she has been making sure she is eating bananas and grapes.   States she has had a few episodes of purging since previous visit. States it mostly happens when she eats out and she eats too eat. States they have been traveling frequently and have been eating out. Denies purging episodes when cooking at home.    Eating history: Length of time: since age 18 Previous treatments:  Goals for RD meetings:   Weight history:  Highest weight:    Lowest weight:  Most consistent weight:   What would you like to weigh: How has weight changed in the past year:   Medical Information:  Changes in hair, skin, nails since ED started:  Chewing/swallowing difficulties:  Reflux or heartburn:  Trouble with teeth:  LMP without the use of hormones:   Weight at that point:  Effect of exercise on menses:    Effect of hormones on menses:  Constipation, diarrhea:  Dizziness/lightheadedness:  Headaches/body aches:  Heart racing/chest pain:  Mood:   Sleep:  Focus/concentration:  Cold intolerance:  Vision changes:   Mental health diagnosis: bulimia nervosa   Dietary assessment: A typical day consists of 3 meals and 1 snacks  Safe foods include:   Avoided foods include:  24 hour recall:  B (7 am): croissant/muffin + bacon/sausage or [place]-sausage biscuit + soda S L (10:30 am): skipped or chicken salad sandwich  S D (5-7 pm): Chickfila-4 strips + few fries or chicken alfredo (small bowl) + chicken salad sandwich on sourdough bread; feels like this was too much and purged  S (10 pm):   Beverages: soda (21 oz), water (2*16.9 oz; 33.8 oz); 54.8 oz  What Methods Do You Use To Control Your Weight (Compensatory behaviors)?           Restricting (calories, fat, carbs)  SIV  Diet pills  Laxatives  Diuretics  Alcohol or drugs  Exercise (what type)  Food rules or rituals (explain)  Binge  Estimated energy intake: 1000-1100 kcal  Estimated energy needs: 1600-1800 kcal 180-200 g CHO 120-135 g pro 44-50 g fat  Nutrition Diagnosis: NB-1.5 Disordered eating pattern As related to bulimia nervosa.  As evidenced by purging.  Intervention/Goals: Nutrition education and counseling. Pt was encouraged with changes made from previous visit. Discussed the purpose of each food group and ways to create balance with already established regimen. Discussed ways to increase water intake. Pt agreed with goals listed.  Goals: - Aim to have balanced meals even when eating out. Example includes: Chickfila-2 chicken strips + side salad + fries/fruit  cup - Aim to have balanced afternoon snack to include carbohydrates + protein such as: Fruit + nuts Fruit + peanut butter - Continue to have at least 2 bottles of water a day.   Meal plan:    3 meals    1-2 snacks  Monitoring and Evaluation: Patient will follow up in 4 weeks.

## 2023-06-13 ENCOUNTER — Encounter: Admitting: Registered"

## 2023-06-14 ENCOUNTER — Telehealth: Admitting: Registered"

## 2023-06-21 ENCOUNTER — Other Ambulatory Visit: Payer: Self-pay | Admitting: Internal Medicine

## 2023-06-30 ENCOUNTER — Ambulatory Visit: Admitting: Internal Medicine

## 2023-07-04 ENCOUNTER — Other Ambulatory Visit: Payer: Self-pay | Admitting: Internal Medicine

## 2023-07-11 ENCOUNTER — Other Ambulatory Visit: Payer: Self-pay | Admitting: Internal Medicine

## 2023-07-16 ENCOUNTER — Encounter: Payer: Self-pay | Admitting: Internal Medicine

## 2023-07-17 ENCOUNTER — Other Ambulatory Visit: Payer: Self-pay

## 2023-07-17 MED ORDER — CITALOPRAM HYDROBROMIDE 10 MG PO TABS: ORAL_TABLET | ORAL | 2 refills | Status: DC

## 2023-07-21 ENCOUNTER — Ambulatory Visit: Admitting: Internal Medicine

## 2023-08-22 ENCOUNTER — Other Ambulatory Visit: Payer: Self-pay | Admitting: Internal Medicine

## 2023-08-22 ENCOUNTER — Encounter: Payer: Self-pay | Admitting: Internal Medicine

## 2023-08-22 ENCOUNTER — Other Ambulatory Visit: Payer: Self-pay

## 2023-08-22 MED ORDER — CITALOPRAM HYDROBROMIDE 10 MG PO TABS: ORAL_TABLET | ORAL | 2 refills | Status: DC

## 2023-08-28 ENCOUNTER — Ambulatory Visit: Admitting: Internal Medicine

## 2023-08-30 NOTE — Telephone Encounter (Signed)
 Is she still seeing this provider? If so, then she needs to get refilled from them.

## 2023-08-30 NOTE — Telephone Encounter (Signed)
 Medication was refilled by [facility]. [place] an [facility] provider

## 2023-08-30 NOTE — Telephone Encounter (Signed)
 Are you ok with filling the imipramine?

## 2023-08-30 NOTE — Telephone Encounter (Signed)
 Need to confirm from pharmacy who has been refilling the medication. In reviewing our records, I do not see where I have refilled.

## 2023-09-20 ENCOUNTER — Encounter: Payer: Self-pay | Admitting: Internal Medicine

## 2023-09-20 MED ORDER — LEVOTHYROXINE SODIUM 50 MCG PO TABS: 50.0000 ug | ORAL_TABLET | Freq: Every day | ORAL | 0 refills | Status: DC

## 2023-10-06 ENCOUNTER — Ambulatory Visit: Admitting: Internal Medicine

## 2023-11-09 ENCOUNTER — Other Ambulatory Visit: Payer: Self-pay

## 2023-11-09 DIAGNOSIS — D334 Benign neoplasm of spinal cord: Secondary | ICD-10-CM

## 2023-11-18 ENCOUNTER — Encounter: Payer: Self-pay | Admitting: Internal Medicine

## 2023-11-20 ENCOUNTER — Other Ambulatory Visit: Payer: Self-pay

## 2023-11-20 MED ORDER — CITALOPRAM HYDROBROMIDE 10 MG PO TABS: ORAL_TABLET | ORAL | 0 refills | Status: DC

## 2023-12-04 ENCOUNTER — Ambulatory Visit: Admitting: Internal Medicine

## 2023-12-05 ENCOUNTER — Ambulatory Visit: Payer: Self-pay | Admitting: Neurosurgery

## 2023-12-19 ENCOUNTER — Other Ambulatory Visit: Payer: Self-pay | Admitting: Internal Medicine

## 2023-12-20 ENCOUNTER — Other Ambulatory Visit: Payer: Self-pay

## 2023-12-20 DIAGNOSIS — E7800 Pure hypercholesterolemia, unspecified: Secondary | ICD-10-CM

## 2023-12-29 ENCOUNTER — Encounter: Payer: Self-pay | Admitting: Internal Medicine

## 2023-12-29 MED ORDER — LEVOTHYROXINE SODIUM 50 MCG PO TABS: 50.0000 ug | ORAL_TABLET | Freq: Every day | ORAL | 0 refills | Status: DC

## 2024-01-01 ENCOUNTER — Encounter: Payer: Self-pay | Admitting: Internal Medicine

## 2024-01-01 NOTE — Telephone Encounter (Signed)
 I am ok if she has lab appt - please schedule

## 2024-01-04 ENCOUNTER — Encounter: Payer: Self-pay | Admitting: Internal Medicine

## 2024-01-04 ENCOUNTER — Other Ambulatory Visit

## 2024-01-04 ENCOUNTER — Ambulatory Visit: Admitting: Internal Medicine

## 2024-01-04 VITALS — BP 134/78 | HR 94 | Temp 98.2°F | Ht 63.0 in | Wt 127.8 lb

## 2024-01-04 DIAGNOSIS — E7800 Pure hypercholesterolemia, unspecified: Secondary | ICD-10-CM | POA: Diagnosis not present

## 2024-01-04 DIAGNOSIS — R944 Abnormal results of kidney function studies: Secondary | ICD-10-CM | POA: Diagnosis not present

## 2024-01-04 DIAGNOSIS — F502 Bulimia nervosa, unspecified: Secondary | ICD-10-CM

## 2024-01-04 DIAGNOSIS — K219 Gastro-esophageal reflux disease without esophagitis: Secondary | ICD-10-CM

## 2024-01-04 DIAGNOSIS — N3010 Interstitial cystitis (chronic) without hematuria: Secondary | ICD-10-CM

## 2024-01-04 DIAGNOSIS — D334 Benign neoplasm of spinal cord: Secondary | ICD-10-CM

## 2024-01-04 DIAGNOSIS — Z1231 Encounter for screening mammogram for malignant neoplasm of breast: Secondary | ICD-10-CM | POA: Diagnosis not present

## 2024-01-04 DIAGNOSIS — Z860100 Personal history of colon polyps, unspecified: Secondary | ICD-10-CM

## 2024-01-04 DIAGNOSIS — D649 Anemia, unspecified: Secondary | ICD-10-CM

## 2024-01-04 LAB — BASIC METABOLIC PANEL WITH GFR
BUN: 14 mg/dL (ref 6–23)
CO2: 29 meq/L (ref 19–32)
Calcium: 8.7 mg/dL (ref 8.4–10.5)
Chloride: 103 meq/L (ref 96–112)
Creatinine, Ser: 1.13 mg/dL (ref 0.40–1.20)
GFR: 51.11 mL/min — ABNORMAL LOW (ref >60.00)
Glucose, Bld: 83 mg/dL (ref 70–99)
Potassium: 3.7 meq/L (ref 3.5–5.1)
Sodium: 140 meq/L (ref 135–145)

## 2024-01-04 LAB — CBC WITH DIFFERENTIAL/PLATELET
Basophils Absolute: 0 K/uL (ref 0.0–0.1)
Basophils Relative: 0.1 % (ref 0.0–3.0)
Eosinophils Absolute: 0.1 K/uL (ref 0.0–0.7)
Eosinophils Relative: 1.2 % (ref 0.0–5.0)
HCT: 37.7 % (ref 36.0–46.0)
Hemoglobin: 13.2 g/dL (ref 12.0–15.0)
Lymphocytes Relative: 26.2 % (ref 12.0–46.0)
Lymphs Abs: 1.9 K/uL (ref 0.7–4.0)
MCHC: 35 g/dL (ref 30.0–36.0)
MCV: 88.5 fl (ref 78.0–100.0)
Monocytes Absolute: 0.4 K/uL (ref 0.1–1.0)
Monocytes Relative: 6.1 % (ref 3.0–12.0)
Neutro Abs: 4.9 K/uL (ref 1.4–7.7)
Neutrophils Relative %: 66.4 % (ref 43.0–77.0)
Platelets: 231 K/uL (ref 150.0–400.0)
RBC: 4.27 Mil/uL (ref 3.87–5.11)
RDW: 13.3 % (ref 11.5–15.5)
WBC: 7.4 K/uL (ref 4.0–10.5)

## 2024-01-04 LAB — HEPATIC FUNCTION PANEL
ALT: 16 U/L (ref 0–35)
AST: 21 U/L (ref 0–37)
Albumin: 4.1 g/dL (ref 3.5–5.2)
Alkaline Phosphatase: 96 U/L (ref 39–117)
Bilirubin, Direct: 0 mg/dL (ref 0.0–0.3)
Total Bilirubin: 0.4 mg/dL (ref 0.2–1.2)
Total Protein: 6.5 g/dL (ref 6.0–8.3)

## 2024-01-04 LAB — LIPID PANEL
Cholesterol: 268 mg/dL — ABNORMAL HIGH (ref 0–200)
HDL: 38.6 mg/dL — ABNORMAL LOW (ref >39.00)
NonHDL: 229.74
Total CHOL/HDL Ratio: 7
Triglycerides: 446 mg/dL — ABNORMAL HIGH (ref 0.0–149.0)
VLDL: 89.2 mg/dL — ABNORMAL HIGH (ref 0.0–40.0)

## 2024-01-04 LAB — LDL CHOLESTEROL, DIRECT: Direct LDL: 114 mg/dL

## 2024-01-04 LAB — TSH: TSH: 4.46 u[IU]/mL (ref 0.35–5.50)

## 2024-01-04 MED ORDER — PANTOPRAZOLE SODIUM 40 MG PO TBEC: DELAYED_RELEASE_TABLET | ORAL | 1 refills | Status: AC

## 2024-01-04 MED ORDER — CITALOPRAM HYDROBROMIDE 10 MG PO TABS: ORAL_TABLET | ORAL | 1 refills | Status: AC

## 2024-01-04 MED ORDER — EZETIMIBE 10 MG PO TABS: 10.0000 mg | ORAL_TABLET | Freq: Every day | ORAL | 1 refills | Status: AC

## 2024-01-04 MED ORDER — LEVOTHYROXINE SODIUM 50 MCG PO TABS: 50.0000 ug | ORAL_TABLET | Freq: Every day | ORAL | 1 refills | Status: AC
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
## 2024-01-05 ENCOUNTER — Ambulatory Visit
Admission: RE | Admit: 2024-01-05 | Discharge: 2024-01-05 | Disposition: A | Discharge status: Home / Self Care | Source: Ambulatory Visit | Attending: Internal Medicine | Admitting: Internal Medicine

## 2024-01-05 ENCOUNTER — Ambulatory Visit: Payer: Self-pay | Admitting: Internal Medicine

## 2024-01-05 DIAGNOSIS — Z1231 Encounter for screening mammogram for malignant neoplasm of breast: Secondary | ICD-10-CM | POA: Diagnosis present

## 2024-01-09 NOTE — Telephone Encounter (Signed)
 Copied from CRM [id number]. Topic: Clinical - Lab/Test Results >> Jan 09, 2024  2:49 PM [name] wrote: Reason for CRM: Pt returning call for Linda Perez regarding recent lab results. Pt said someone has already gone over lab results, but she would like a call regarding her mammogram results.

## 2024-01-10 NOTE — Addendum Note (Signed)
 Addended by: [place] Duquette W on: 01/10/2024 11:42 AM   Modules accepted: Orders

## 2024-01-14 ENCOUNTER — Encounter: Payer: Self-pay | Admitting: Internal Medicine

## 2024-01-14 NOTE — Assessment & Plan Note (Signed)
 Had f/u with urology 08/30/23 - f/u IC. Continue imipramine and vesicare.

## 2024-01-14 NOTE — Assessment & Plan Note (Signed)
Eating better.  Weight is up.  Follow.

## 2024-01-14 NOTE — Assessment & Plan Note (Signed)
 Previously saw [facility] (Dr Clois).  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  Recommended f/u in 2 years. Last seen 11/2021. Need to confirm f/u.

## 2024-01-14 NOTE — Assessment & Plan Note (Signed)
 Follow cbc.

## 2024-01-14 NOTE — Assessment & Plan Note (Signed)
 The 10-year ASCVD risk score (Arnett DK, et al., 2019) is: 8.2%   Values used to calculate the score:     Age: 63 years     Clincally relevant sex: Female     Is Non-Hispanic African American: No     Diabetic: No     Tobacco smoker: No     Systolic Blood Pressure: 134 mmHg     Is BP treated: No     HDL Cholesterol: 38.6 mg/dL     Total Cholesterol: 268 mg/dL  Calcium  score 0. Continue diet and exercise.  Follow lipid panel.

## 2024-01-14 NOTE — Assessment & Plan Note (Signed)
Colonoscopy 2021.  Due f/u 5-7 years.  

## 2024-01-14 NOTE — Assessment & Plan Note (Signed)
 Saw GI 11/09/22 - persistent issues with GERD. Protonix  bid and EGD recommended. No increased upper symptoms reported.

## 2024-01-16 ENCOUNTER — Ambulatory Visit: Admitting: Internal Medicine

## 2024-01-16 ENCOUNTER — Other Ambulatory Visit

## 2024-01-30 ENCOUNTER — Ambulatory Visit: Admitting: Neurosurgery

## 2024-03-06 ENCOUNTER — Encounter: Payer: Self-pay | Admitting: Internal Medicine

## 2024-03-06 DIAGNOSIS — E7800 Pure hypercholesterolemia, unspecified: Secondary | ICD-10-CM

## 2024-03-07 MED ORDER — REPATHA SURECLICK 140 MG/ML ~~LOC~~ SOAJ: 140.0000 mg | SUBCUTANEOUS | 2 refills | Status: AC

## 2024-03-07 NOTE — Telephone Encounter (Signed)
 Please call her and let her know that I am ok with trying repatha . I have discussed this with her previously given her intolerance to statin medication. This is an injection taken q 2 weeks. If agreeable, I can send in rx - need to know which pharmacy she desires.

## 2024-03-07 NOTE — Telephone Encounter (Signed)
Rx sent in for repatha.

## 2024-03-07 NOTE — Telephone Encounter (Signed)
 Spoke to pt. Pt is in agreement with repatha . Medication pended for approval with correct pharmacy

## 2024-03-18 ENCOUNTER — Telehealth: Payer: Self-pay | Admitting: Pharmacy Technician

## 2024-03-18 ENCOUNTER — Other Ambulatory Visit (HOSPITAL_COMMUNITY): Payer: Self-pay

## 2024-03-18 NOTE — Telephone Encounter (Signed)
 Pharmacy Patient Advocate Encounter  Received notification from [facility] that Prior Authorization for Repatha  has been APPROVED from 03/18/24 to 03/18/25. Ran test claim, Copay is $75.00- one month. This test claim was processed through [facility]- copay amounts may vary at other pharmacies due to pharmacy/plan contracts, or as the patient moves through the different stages of their insurance plan.   PA #/Case ID/Reference #: [id number]   Bill tricare as secondary and it brings it down to $0.00.

## 2024-03-18 NOTE — Telephone Encounter (Signed)
 Pharmacy Patient Advocate Encounter   Received notification from Fax that prior authorization for repatha  is required/requested.   Insurance verification completed.   The patient is insured through [facility].   Per test claim: PA required; PA submitted to above mentioned insurance via Latent Key/confirmation #/EOC [id number] Status is pending

## 2024-04-02 ENCOUNTER — Ambulatory Visit: Admitting: Neurosurgery

## 2024-04-04 ENCOUNTER — Ambulatory Visit
Admission: RE | Admit: 2024-04-04 | Discharge: 2024-04-04 | Disposition: A | Source: Ambulatory Visit | Attending: Neurosurgery | Admitting: Neurosurgery

## 2024-04-04 DIAGNOSIS — D334 Benign neoplasm of spinal cord: Secondary | ICD-10-CM

## 2024-04-04 MED ORDER — GADOPICLENOL 0.5 MMOL/ML IV SOLN
7.5000 mL | Freq: Once | INTRAVENOUS | Status: AC | PRN
  Administered 2024-04-04: 5 mL via INTRAVENOUS

## 2024-04-11 ENCOUNTER — Ambulatory Visit

## 2024-05-01 ENCOUNTER — Other Ambulatory Visit

## 2024-05-03 ENCOUNTER — Ambulatory Visit: Admitting: Internal Medicine
